# Patient Record
Sex: Female | Born: 1945 | Race: Black or African American | Hispanic: No | Smoking: Never smoker
Health system: Southern US, Community
[De-identification: ages and names within clinical notes are randomized; demographics above are authoritative.]

## PROBLEM LIST (undated history)

## (undated) DIAGNOSIS — K219 Gastro-esophageal reflux disease without esophagitis: Secondary | ICD-10-CM

## (undated) DIAGNOSIS — I10 Essential (primary) hypertension: Secondary | ICD-10-CM

## (undated) DIAGNOSIS — E785 Hyperlipidemia, unspecified: Secondary | ICD-10-CM

## (undated) DIAGNOSIS — N390 Urinary tract infection, site not specified: Secondary | ICD-10-CM

## (undated) HISTORY — DX: Gastro-esophageal reflux disease without esophagitis: K21.9

## (undated) HISTORY — DX: Essential (primary) hypertension: I10

## (undated) HISTORY — PX: OOPHORECTOMY: SHX86

## (undated) HISTORY — PX: TUBAL LIGATION: SHX77

## (undated) HISTORY — DX: Hyperlipidemia, unspecified: E78.5

## (undated) HISTORY — PX: ABDOMINAL HYSTERECTOMY: SHX81

## (undated) HISTORY — PX: TONSILLECTOMY: SUR1361

---

## 2011-07-21 ENCOUNTER — Emergency Department (INDEPENDENT_AMBULATORY_CARE_PROVIDER_SITE_OTHER): Payer: 59

## 2011-07-21 ENCOUNTER — Encounter: Payer: Self-pay | Admitting: Emergency Medicine

## 2011-07-21 ENCOUNTER — Emergency Department (HOSPITAL_BASED_OUTPATIENT_CLINIC_OR_DEPARTMENT_OTHER)
Admission: EM | Admit: 2011-07-21 | Discharge: 2011-07-21 | Disposition: A | Discharge status: Home / Self Care | Payer: 59 | Attending: Emergency Medicine | Admitting: Emergency Medicine

## 2011-07-21 DIAGNOSIS — R300 Dysuria: Secondary | ICD-10-CM | POA: Insufficient documentation

## 2011-07-21 DIAGNOSIS — R319 Hematuria, unspecified: Secondary | ICD-10-CM | POA: Insufficient documentation

## 2011-07-21 DIAGNOSIS — N3090 Cystitis, unspecified without hematuria: Secondary | ICD-10-CM | POA: Insufficient documentation

## 2011-07-21 DIAGNOSIS — Z79899 Other long term (current) drug therapy: Secondary | ICD-10-CM | POA: Insufficient documentation

## 2011-07-21 DIAGNOSIS — E119 Type 2 diabetes mellitus without complications: Secondary | ICD-10-CM | POA: Insufficient documentation

## 2011-07-21 DIAGNOSIS — K5730 Diverticulosis of large intestine without perforation or abscess without bleeding: Secondary | ICD-10-CM

## 2011-07-21 DIAGNOSIS — K8020 Calculus of gallbladder without cholecystitis without obstruction: Secondary | ICD-10-CM

## 2011-07-21 DIAGNOSIS — R109 Unspecified abdominal pain: Secondary | ICD-10-CM

## 2011-07-21 DIAGNOSIS — R350 Frequency of micturition: Secondary | ICD-10-CM

## 2011-07-21 LAB — CBC
Hemoglobin: 12.3 g/dL (ref 12.0–15.0)
RBC: 4.45 MIL/uL (ref 3.87–5.11)
WBC: 12 10*3/uL — ABNORMAL HIGH (ref 4.0–10.5)

## 2011-07-21 LAB — BASIC METABOLIC PANEL
CO2: 25 mEq/L (ref 19–32)
Chloride: 101 mEq/L (ref 96–112)
GFR calc non Af Amer: >60 mL/min (ref >60)
Glucose, Bld: 103 mg/dL — ABNORMAL HIGH (ref 70–99)
Potassium: 3.8 mEq/L (ref 3.5–5.1)
Sodium: 138 mEq/L (ref 135–145)

## 2011-07-21 LAB — URINE MICROSCOPIC-ADD ON

## 2011-07-21 LAB — URINALYSIS, ROUTINE W REFLEX MICROSCOPIC
Bilirubin Urine: NEGATIVE
Glucose, UA: NEGATIVE mg/dL
Ketones, ur: NEGATIVE mg/dL
pH: 6 (ref 5.0–8.0)

## 2011-07-21 MED ORDER — TRAMADOL HCL 50 MG PO TABS: 50.0000 mg | ORAL_TABLET | Freq: Once | ORAL | Status: DC

## 2011-07-21 MED ORDER — CIPROFLOXACIN HCL 500 MG PO TABS
500.0000 mg | ORAL_TABLET | Freq: Once | ORAL | Status: AC
  Administered 2011-07-21: 500 mg via ORAL
  Filled 2011-07-21: qty 1

## 2011-07-21 MED ORDER — TRAMADOL HCL 50 MG PO TABS
ORAL_TABLET | ORAL | Status: AC
  Filled 2011-07-21: qty 1

## 2011-07-21 MED ORDER — CIPROFLOXACIN HCL 500 MG PO TABS: 500.0000 mg | ORAL_TABLET | Freq: Two times a day (BID) | ORAL | Status: AC

## 2011-07-21 MED ORDER — HYDROCODONE-ACETAMINOPHEN 5-325 MG PO TABS: 1.0000 | ORAL_TABLET | ORAL | Status: AC | PRN

## 2011-07-21 NOTE — ED Notes (Signed)
Pt report burning with urination and frequency

## 2011-07-21 NOTE — ED Provider Notes (Signed)
History     CSN: [id number] Arrival date & time: 07/21/2011  2:34 AM  Chief Complaint  Patient presents with  . Urinary Tract Infection    HPI  (Consider location/radiation/quality/duration/timing/severity/associated sxs/prior treatment)  Patient is a 65 y.o. female presenting with urinary tract infection and hematuria. The history is provided by the patient. No language interpreter was used.  Urinary Tract Infection This is a new problem. The current episode started yesterday. The problem occurs constantly. The problem has not changed since onset.Pertinent negatives include no chest pain, no abdominal pain, no headaches and no shortness of breath. Exacerbated by: urinating. Relieved by: not urinating.  Hematuria This is a new problem. The current episode started yesterday. The problem is unchanged. She describes the hematuria as gross hematuria. The hematuria occurs during the initial portion of her urinary stream. She reports no clotting in her urine stream. Her pain is at a severity of 9/10. The pain is severe. She describes her urine color as tea colored. Irritative symptoms include frequency and urgency. Obstructive symptoms do not include dribbling, an intermittent stream, a slower stream or straining. Associated symptoms include dysuria and flank pain. Pertinent negatives include no abdominal pain, facial swelling, fever, inability to urinate, nausea or vomiting. Her sexual activity is non-contributory to the current illness. There is no history of GU trauma or kidney stones.    Past Medical History  Diagnosis Date  . Diabetes mellitus     Past Surgical History  Procedure Date  . Abdominal hysterectomy     No family history on file.  History  Substance Use Topics  . Smoking status: Never Smoker   . Smokeless tobacco: Not on file  . Alcohol Use: No    OB History    Grav Para Term Preterm Abortions TAB SAB Ect Mult Living                  Review of Systems  Review  of Systems  Constitutional: Negative for fever.  HENT: Negative for facial swelling.   Eyes: Negative for discharge.  Respiratory: Negative for apnea and shortness of breath.   Cardiovascular: Negative for chest pain.  Gastrointestinal: Negative for nausea, vomiting, abdominal pain and abdominal distention.  Genitourinary: Positive for dysuria, urgency, frequency, hematuria and flank pain. Negative for genital sores.  Musculoskeletal: Negative for arthralgias.  Neurological: Negative for dizziness and headaches.  Hematological: Negative.   Psychiatric/Behavioral: Negative.     Allergies  Erythromycin; Penicillins; and Sulfa antibiotics  Home Medications   Current Outpatient Rx  Name Route Sig Dispense Refill  . EZETIMIBE 10 MG PO TABS Oral Take 10 mg by mouth daily.      Marland Kitchen GLIPIZIDE 10 MG PO TABS Oral Take 10 mg by mouth 2 (two) times daily before a meal.      . LISINOPRIL 10 MG PO TABS Oral Take 10 mg by mouth daily.      Marland Kitchen METFORMIN HCL 500 MG (MOD) PO TB24 Oral Take 500 mg by mouth daily with breakfast.      . PANTOPRAZOLE SODIUM 20 MG PO TBEC Oral Take 20 mg by mouth daily.      Marland Kitchen ROSUVASTATIN CALCIUM 10 MG PO TABS Oral Take 10 mg by mouth daily.        Physical Exam    BP 126/86  Pulse 79  Temp 98 F (36.7 C)  Resp 18  SpO2 99%  Physical Exam  Constitutional: She is oriented to person, place, and time. She appears well-developed  and well-nourished.  HENT:  Head: Normocephalic and atraumatic.  Eyes: EOM are normal. Pupils are equal, round, and reactive to light. Right eye exhibits no discharge. Left eye exhibits no discharge.  Neck: Normal range of motion. Neck supple.  Cardiovascular: Normal rate and regular rhythm.   No murmur heard. Pulmonary/Chest: Effort normal and breath sounds normal. No respiratory distress.  Abdominal: Soft. Bowel sounds are normal. There is no tenderness. There is no rebound and no guarding.  Musculoskeletal: Normal range of motion.    Neurological: She is alert and oriented to person, place, and time.  Skin: Skin is warm and dry.  Psychiatric: She has a normal mood and affect.    ED Course  Procedures (including critical care time)  Labs Reviewed  URINALYSIS, ROUTINE W REFLEX MICROSCOPIC - Abnormal; Notable for the following:    Color, Urine RED (*) BIOCHEMICALS MAY BE AFFECTED BY COLOR   Appearance CLOUDY (*)    Hgb urine dipstick LARGE (*)    Protein, ur 100 (*)    Leukocytes, UA LARGE (*)    All other components within normal limits  URINE MICROSCOPIC-ADD ON - Abnormal; Notable for the following:    Squamous Epithelial / LPF FEW (*)    Bacteria, UA FEW (*)    All other components within normal limits  CBC - Abnormal; Notable for the following:    WBC 12.0 (*)    All other components within normal limits  BASIC METABOLIC PANEL - Abnormal; Notable for the following:    Glucose, Bld 103 (*)    All other components within normal limits   No results found.   No diagnosis found.   MDM   Patient informed of CT results and need to follow up for recheck of urine and to return for fevers, chills, inability to tolerate medications or any concerns.  She must also inform PMD of incidental finding of gall stones.  Patient verbalizes understanding and agrees to follow up   Tammara Massing Smitty Cords, MD 07/21/11 [phone]

## 2011-11-10 HISTORY — PX: CHOLECYSTECTOMY: SHX55

## 2013-02-19 ENCOUNTER — Emergency Department (HOSPITAL_BASED_OUTPATIENT_CLINIC_OR_DEPARTMENT_OTHER)
Admission: EM | Admit: 2013-02-19 | Discharge: 2013-02-19 | Disposition: A | Discharge status: Home / Self Care | Payer: PRIVATE HEALTH INSURANCE | Attending: Emergency Medicine | Admitting: Emergency Medicine

## 2013-02-19 ENCOUNTER — Emergency Department (HOSPITAL_BASED_OUTPATIENT_CLINIC_OR_DEPARTMENT_OTHER): Payer: PRIVATE HEALTH INSURANCE

## 2013-02-19 ENCOUNTER — Encounter (HOSPITAL_BASED_OUTPATIENT_CLINIC_OR_DEPARTMENT_OTHER): Payer: Self-pay

## 2013-02-19 DIAGNOSIS — R059 Cough, unspecified: Secondary | ICD-10-CM | POA: Insufficient documentation

## 2013-02-19 DIAGNOSIS — Z87440 Personal history of urinary (tract) infections: Secondary | ICD-10-CM | POA: Insufficient documentation

## 2013-02-19 DIAGNOSIS — J209 Acute bronchitis, unspecified: Secondary | ICD-10-CM | POA: Insufficient documentation

## 2013-02-19 DIAGNOSIS — R51 Headache: Secondary | ICD-10-CM | POA: Insufficient documentation

## 2013-02-19 DIAGNOSIS — Z880 Allergy status to penicillin: Secondary | ICD-10-CM | POA: Insufficient documentation

## 2013-02-19 DIAGNOSIS — E119 Type 2 diabetes mellitus without complications: Secondary | ICD-10-CM | POA: Insufficient documentation

## 2013-02-19 DIAGNOSIS — Z79899 Other long term (current) drug therapy: Secondary | ICD-10-CM | POA: Insufficient documentation

## 2013-02-19 DIAGNOSIS — J3489 Other specified disorders of nose and nasal sinuses: Secondary | ICD-10-CM | POA: Insufficient documentation

## 2013-02-19 DIAGNOSIS — Z7982 Long term (current) use of aspirin: Secondary | ICD-10-CM | POA: Insufficient documentation

## 2013-02-19 DIAGNOSIS — R05 Cough: Secondary | ICD-10-CM | POA: Insufficient documentation

## 2013-02-19 DIAGNOSIS — J40 Bronchitis, not specified as acute or chronic: Secondary | ICD-10-CM

## 2013-02-19 HISTORY — DX: Urinary tract infection, site not specified: N39.0

## 2013-02-19 MED ORDER — ALBUTEROL SULFATE HFA 108 (90 BASE) MCG/ACT IN AERS: 1.0000 | INHALATION_SPRAY | Freq: Four times a day (QID) | RESPIRATORY_TRACT | Status: AC | PRN

## 2013-02-19 MED ORDER — LEVOFLOXACIN 500 MG PO TABS: 500.0000 mg | ORAL_TABLET | Freq: Every day | ORAL | Status: DC

## 2013-02-19 NOTE — ED Notes (Signed)
Pt states that she has had uri symptoms x1 week or more, now states that she is concerned she may be getting pna.

## 2013-02-19 NOTE — ED Provider Notes (Signed)
History     CSN: [id number]  Arrival date & time 02/19/13  1138   First MD Initiated Contact with Patient 02/19/13 1351      Chief Complaint  Patient presents with  . URI    (Consider location/radiation/quality/duration/timing/severity/associated sxs/prior treatment) Patient is a 67 y.o. female presenting with URI. The history is provided by the patient. No language interpreter was used.  URI Presenting symptoms: congestion and cough   Severity:  Moderate Onset quality:  Gradual Duration:  1 week Timing:  Constant Progression:  Worsening Chronicity:  New Relieved by:  Nothing Worsened by:  Nothing tried Ineffective treatments:  None tried Associated symptoms: headaches   Risk factors: no sick contacts     Past Medical History  Diagnosis Date  . Diabetes mellitus   . UTI (lower urinary tract infection)     Past Surgical History  Procedure Laterality Date  . Abdominal hysterectomy    . Cholecystectomy  11/10/2011    History reviewed. No pertinent family history.  History  Substance Use Topics  . Smoking status: Never Smoker   . Smokeless tobacco: Never Used  . Alcohol Use: No    OB History   Grav Para Term Preterm Abortions TAB SAB Ect Mult Living                  Review of Systems  HENT: Positive for congestion.   Respiratory: Positive for cough.   Neurological: Positive for headaches.  All other systems reviewed and are negative.    Allergies  Darvon; Erythromycin; Lipitor; Penicillins; Sulfa antibiotics; and Zocor  Home Medications   Current Outpatient Rx  Name  Route  Sig  Dispense  Refill  . aspirin 81 MG tablet   Oral   Take 81 mg by mouth daily.         Marland Kitchen ezetimibe (ZETIA) 10 MG tablet   Oral   Take 10 mg by mouth daily.           Marland Kitchen glipiZIDE (GLUCOTROL) 10 MG tablet   Oral   Take 10 mg by mouth 2 (two) times daily before a meal.           . lisinopril (PRINIVIL,ZESTRIL) 10 MG tablet   Oral   Take 10 mg by mouth daily.            . metFORMIN (GLUMETZA) 500 MG (MOD) 24 hr tablet   Oral   Take 500 mg by mouth daily with breakfast.           . pantoprazole (PROTONIX) 20 MG tablet   Oral   Take 40 mg by mouth 2 (two) times daily.          . rosuvastatin (CRESTOR) 10 MG tablet   Oral   Take 10 mg by mouth daily.             BP 154/90  Pulse 67  Temp(Src) 98.1 F (36.7 C) (Oral)  Resp 18  Ht 5\' 2"  (1.575 m)  Wt 183 lb (83.008 kg)  BMI 33.46 kg/m2  SpO2 95%  Physical Exam  Nursing note and vitals reviewed. Constitutional: She is oriented to person, place, and time. She appears well-developed and well-nourished.  HENT:  Head: Normocephalic.  Right Ear: External ear normal.  Eyes: Conjunctivae and EOM are normal. Pupils are equal, round, and reactive to light.  Neck: Normal range of motion. Neck supple.  Cardiovascular: Normal rate.   Pulmonary/Chest: Effort normal.  Abdominal: Soft.  Musculoskeletal: Normal range  of motion.  Neurological: She is alert and oriented to person, place, and time.  Skin: Skin is warm.  Psychiatric: She has a normal mood and affect.    ED Course  Procedures (including critical care time)  Labs Reviewed - No data to display Dg Chest 2 View  02/19/2013  *RADIOLOGY REPORT*  Clinical Data: Upper respiratory tract infection  CHEST - 2 VIEW  Comparison: None  Findings: The heart size and mediastinal contours are within normal limits.  Both lungs are clear.  The visualized skeletal structures are remarkable for degenerative disc disease.  Prior cholecystectomy.  IMPRESSION: No active cardiopulmonary abnormalities.   Original Report Authenticated By: Signa Kell, M.D.      No diagnosis found.    MDM   levaquin and albuterol inhaler,   See your Physicain for recheck in 1 week if symptoms persist      Elson Areas, PA-C 02/19/13 1407

## 2013-02-20 NOTE — ED Provider Notes (Signed)
Medical screening examination/treatment/procedure(s) were performed by non-physician practitioner and as supervising physician I was immediately available for consultation/collaboration.   Mont Jagoda W. Aryahna Spagna, MD 02/20/13 0712 

## 2014-06-12 DIAGNOSIS — Z79899 Other long term (current) drug therapy: Secondary | ICD-10-CM | POA: Insufficient documentation

## 2014-06-12 DIAGNOSIS — R300 Dysuria: Secondary | ICD-10-CM | POA: Insufficient documentation

## 2014-06-12 DIAGNOSIS — E785 Hyperlipidemia, unspecified: Secondary | ICD-10-CM | POA: Insufficient documentation

## 2014-06-12 DIAGNOSIS — I10 Essential (primary) hypertension: Secondary | ICD-10-CM | POA: Insufficient documentation

## 2014-06-12 DIAGNOSIS — E1165 Type 2 diabetes mellitus with hyperglycemia: Secondary | ICD-10-CM | POA: Insufficient documentation

## 2014-09-05 IMAGING — CR DG CHEST 2V
2 series · 2 of 2 positions shown · non-contrast
Comparison: None

CLINICAL DATA: Upper respiratory tract infection

CHEST - 2 VIEW

[w chest pa]
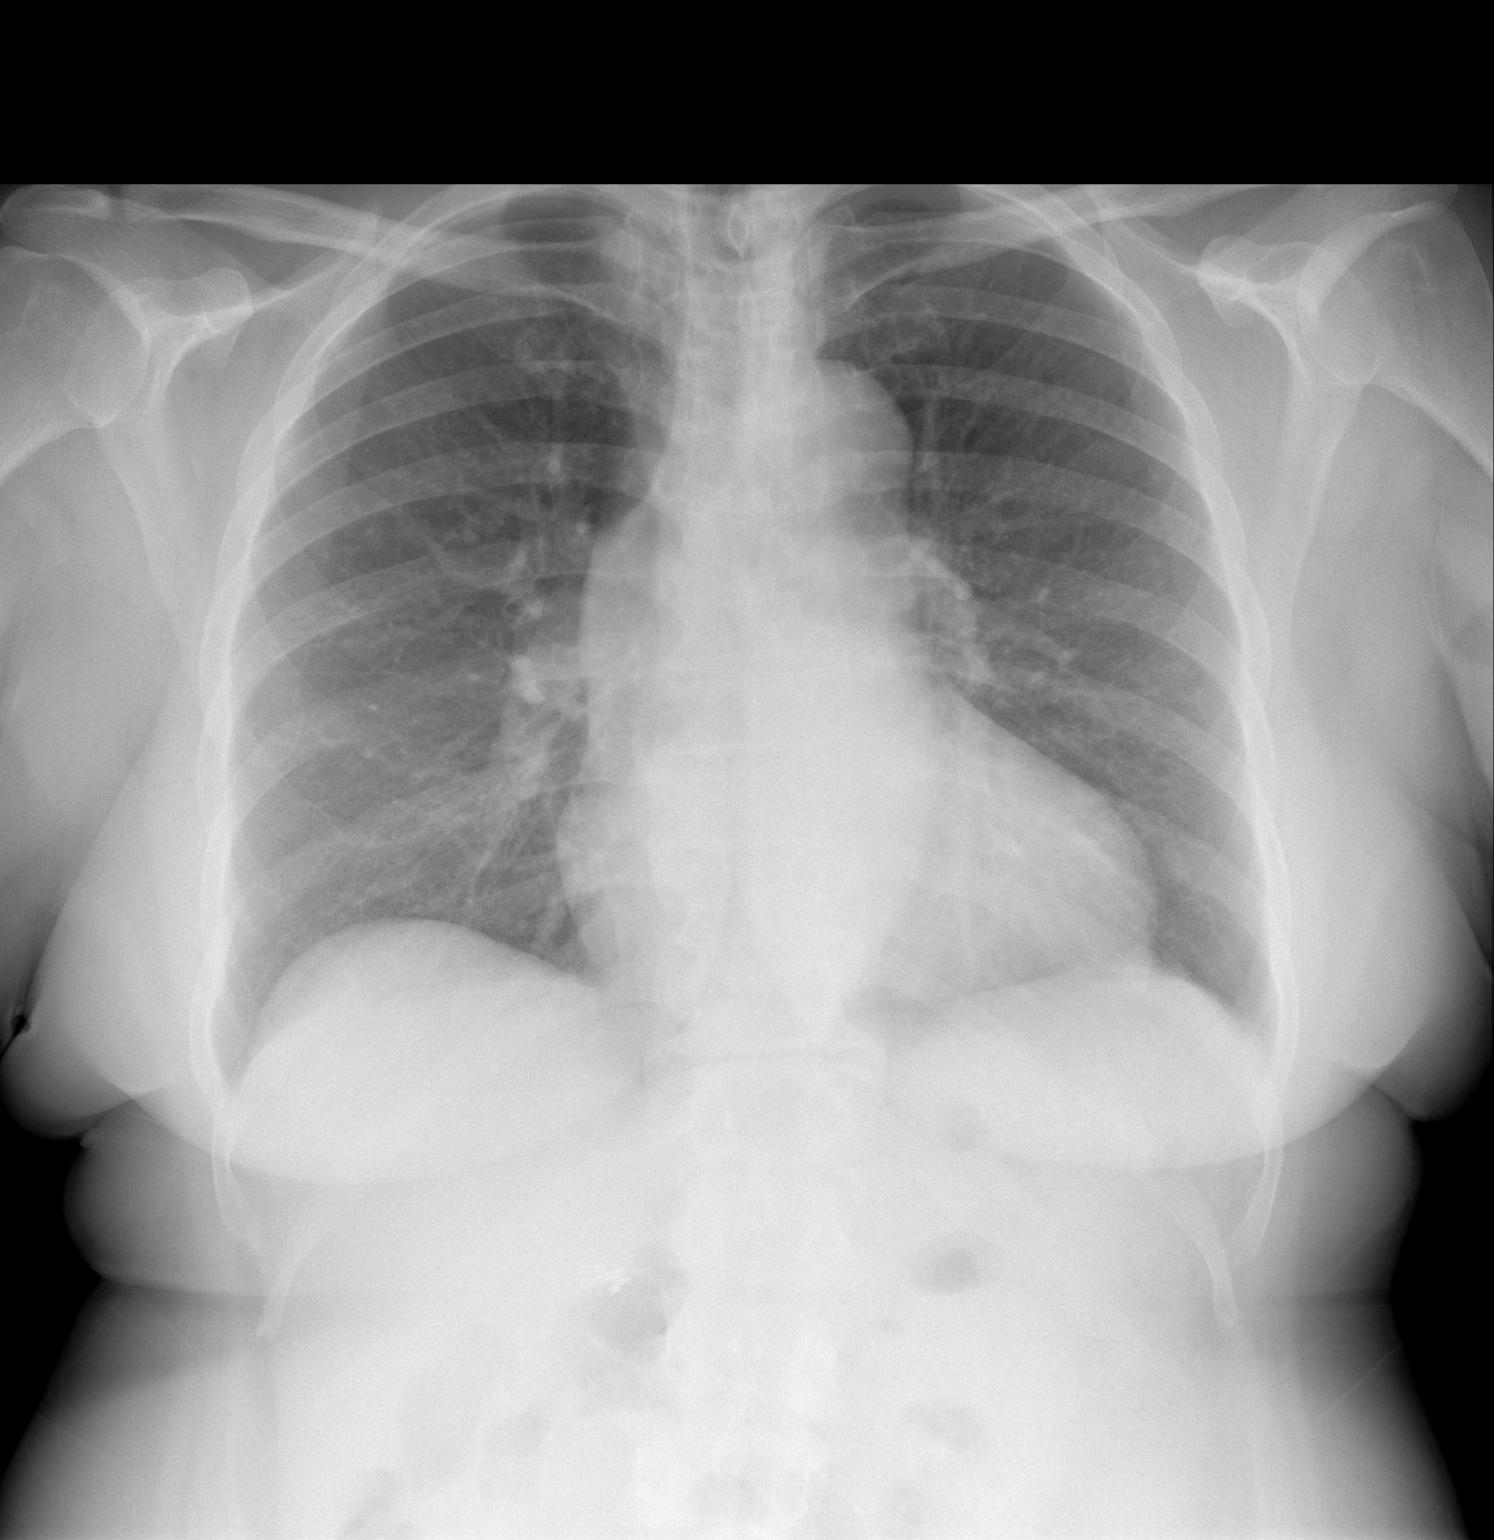

[w chest lat]
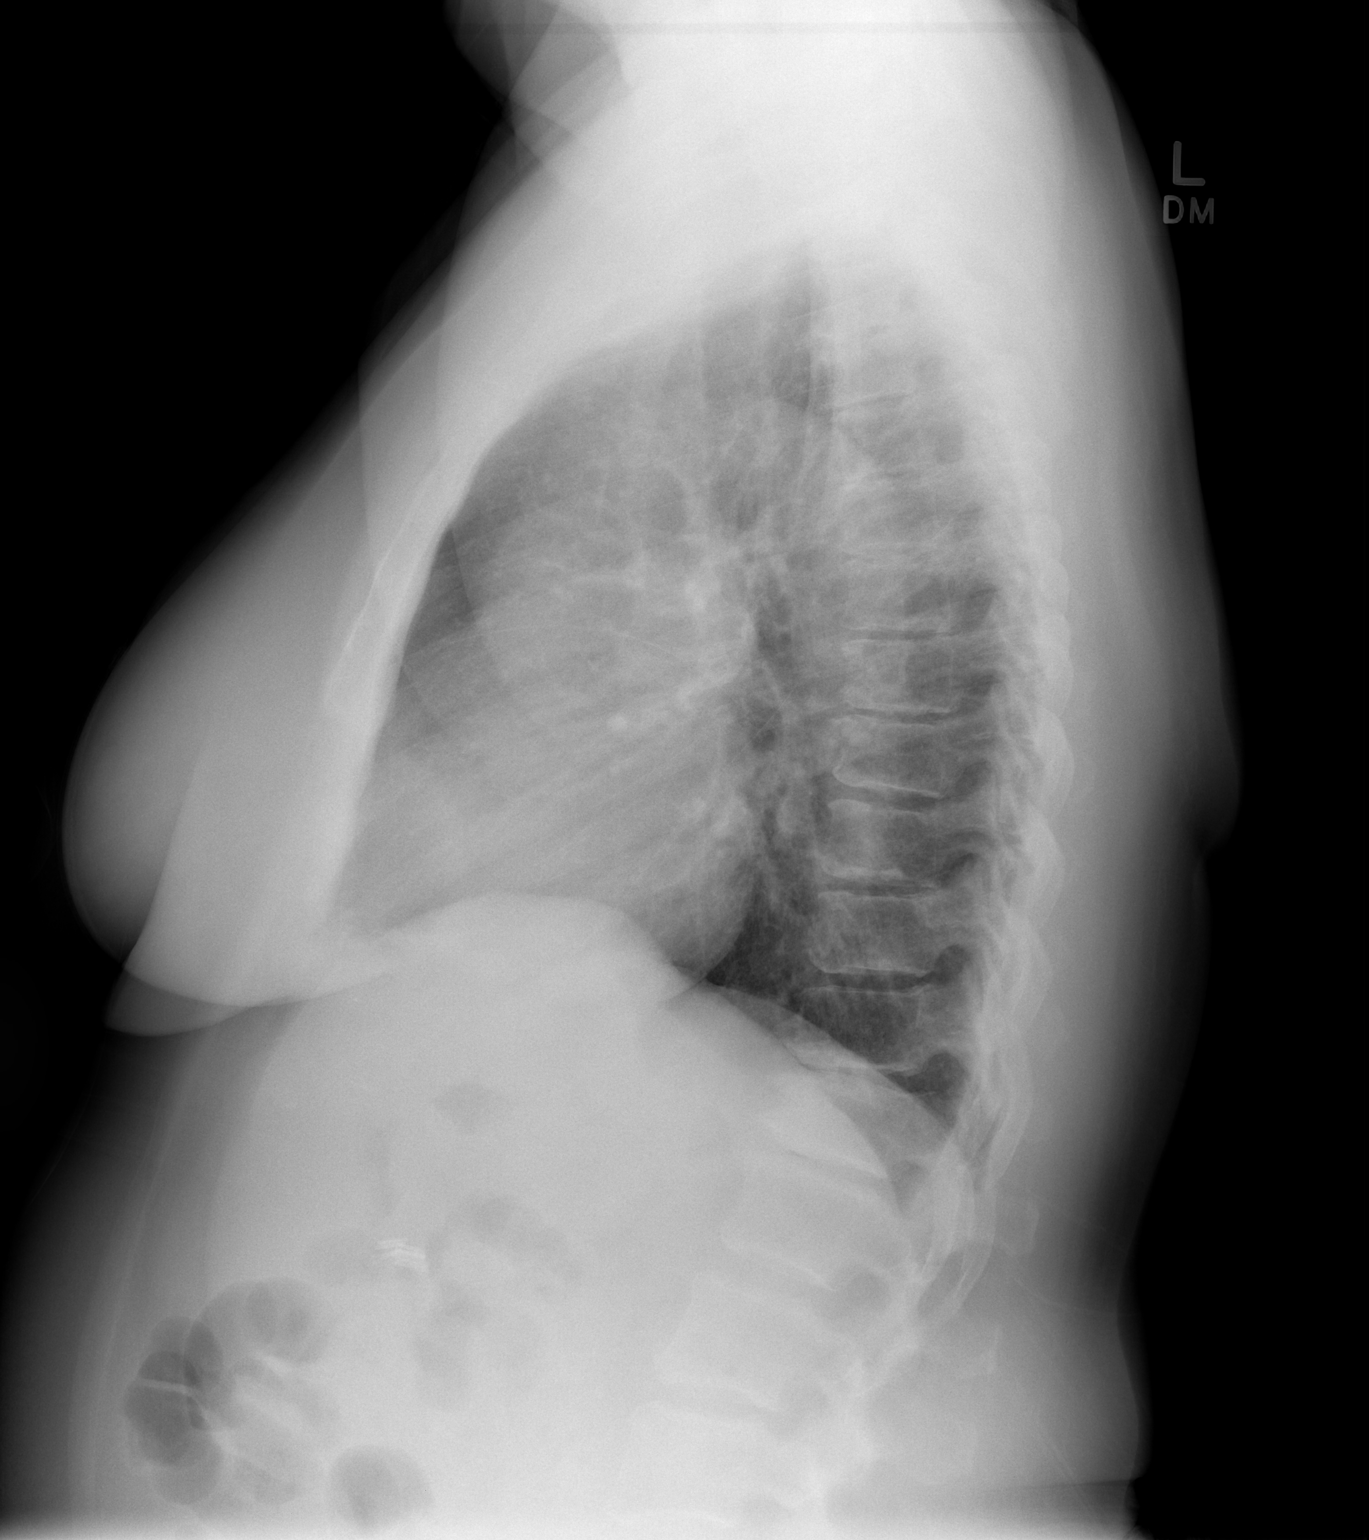

[2 of 2 positions shown; findings below may reference images not displayed]

FINDINGS: The heart size and mediastinal contours are within normal
limits.  Both lungs are clear.  The visualized skeletal structures
are remarkable for degenerative disc disease.  Prior
cholecystectomy..
IMPRESSION: No active cardiopulmonary abnormalities.

## 2015-01-09 DIAGNOSIS — K219 Gastro-esophageal reflux disease without esophagitis: Secondary | ICD-10-CM | POA: Insufficient documentation

## 2015-09-06 DIAGNOSIS — Z860100 Personal history of colon polyps, unspecified: Secondary | ICD-10-CM | POA: Insufficient documentation

## 2016-01-09 DIAGNOSIS — M159 Polyosteoarthritis, unspecified: Secondary | ICD-10-CM | POA: Insufficient documentation

## 2016-01-09 DIAGNOSIS — L299 Pruritus, unspecified: Secondary | ICD-10-CM | POA: Insufficient documentation

## 2016-06-20 DIAGNOSIS — J019 Acute sinusitis, unspecified: Secondary | ICD-10-CM | POA: Insufficient documentation

## 2016-06-20 DIAGNOSIS — L219 Seborrheic dermatitis, unspecified: Secondary | ICD-10-CM | POA: Insufficient documentation

## 2017-01-19 DIAGNOSIS — H2513 Age-related nuclear cataract, bilateral: Secondary | ICD-10-CM | POA: Insufficient documentation

## 2017-02-20 ENCOUNTER — Emergency Department (HOSPITAL_BASED_OUTPATIENT_CLINIC_OR_DEPARTMENT_OTHER): Payer: Medicare Other

## 2017-02-20 ENCOUNTER — Emergency Department (HOSPITAL_BASED_OUTPATIENT_CLINIC_OR_DEPARTMENT_OTHER)
Admission: EM | Admit: 2017-02-20 | Discharge: 2017-02-20 | Disposition: A | Discharge status: Home / Self Care | Payer: Medicare Other | Attending: Emergency Medicine | Admitting: Emergency Medicine

## 2017-02-20 ENCOUNTER — Encounter (HOSPITAL_BASED_OUTPATIENT_CLINIC_OR_DEPARTMENT_OTHER): Payer: Self-pay | Admitting: Emergency Medicine

## 2017-02-20 DIAGNOSIS — E119 Type 2 diabetes mellitus without complications: Secondary | ICD-10-CM | POA: Diagnosis not present

## 2017-02-20 DIAGNOSIS — Z7984 Long term (current) use of oral hypoglycemic drugs: Secondary | ICD-10-CM | POA: Diagnosis not present

## 2017-02-20 DIAGNOSIS — Z7982 Long term (current) use of aspirin: Secondary | ICD-10-CM | POA: Insufficient documentation

## 2017-02-20 DIAGNOSIS — M25512 Pain in left shoulder: Secondary | ICD-10-CM | POA: Insufficient documentation

## 2017-02-20 DIAGNOSIS — Z79899 Other long term (current) drug therapy: Secondary | ICD-10-CM | POA: Insufficient documentation

## 2017-02-20 MED ORDER — HYDROCODONE-ACETAMINOPHEN 5-325 MG PO TABS: 1.0000 | ORAL_TABLET | Freq: Four times a day (QID) | ORAL | 0 refills | Status: DC | PRN

## 2017-02-20 MED FILL — HYDROCODON-APAP 5-325: 5-325 | 3 days supply | Qty: 20 | Fill #0

## 2017-02-20 NOTE — ED Provider Notes (Signed)
[facility]   CSN: [id number] Arrival date & time: 02/20/17  1416     History   Chief Complaint Chief Complaint  Patient presents with  . Shoulder Pain    HPI Judith Hensley is a 71 y.o. female.  Patient with complaint of left shoulder pain for 2 days. Patient's had some pain in this shoulder in the past. But much worse past couple days. Patient does sleep with her arm above her head. Patient occasionally gets some right shoulder pain as well. No direct or known injury. Very difficult for her to raise her arm above her head. Denies any numbness to the fingers. Does seem to be very weak in the shoulder area.      Past Medical History:  Diagnosis Date  . Diabetes mellitus   . UTI (lower urinary tract infection)     There are no active problems to display for this patient.   Past Surgical History:  Procedure Laterality Date  . ABDOMINAL HYSTERECTOMY    . CHOLECYSTECTOMY  11/10/2011  . TONSILLECTOMY      OB History    No data available       Home Medications    Prior to Admission medications   Medication Sig Start Date End Date Taking? Authorizing Provider  albuterol (PROVENTIL HFA;VENTOLIN HFA) 108 (90 BASE) MCG/ACT inhaler Inhale 1-2 puffs into the lungs every 6 (six) hours as needed for wheezing. 02/19/13   Elson Areas, PA-C  aspirin 81 MG tablet Take 81 mg by mouth daily.    Historical Provider, MD  ezetimibe (ZETIA) 10 MG tablet Take 10 mg by mouth daily.      Historical Provider, MD  glipiZIDE (GLUCOTROL) 10 MG tablet Take 10 mg by mouth 2 (two) times daily before a meal.      Historical Provider, MD  HYDROcodone-acetaminophen (NORCO/VICODIN) 5-325 MG tablet Take 1-2 tablets by mouth every 6 (six) hours as needed for moderate pain. 02/20/17   Vanetta Mulders, MD  levofloxacin (LEVAQUIN) 500 MG tablet Take 1 tablet (500 mg total) by mouth daily. 02/19/13   Elson Areas, PA-C  lisinopril (PRINIVIL,ZESTRIL) 10 MG tablet Take 10 mg by  mouth daily.      Historical Provider, MD  metFORMIN (GLUMETZA) 500 MG (MOD) 24 hr tablet Take 500 mg by mouth daily with breakfast.      Historical Provider, MD  pantoprazole (PROTONIX) 20 MG tablet Take 40 mg by mouth 2 (two) times daily.     Historical Provider, MD  rosuvastatin (CRESTOR) 10 MG tablet Take 10 mg by mouth daily.      Historical Provider, MD    Family History History reviewed. No pertinent family history.  Social History Social History  Substance Use Topics  . Smoking status: Never Smoker  . Smokeless tobacco: Never Used  . Alcohol use No     Allergies   Darvon [propoxyphene hcl]; Erythromycin; Lipitor [atorvastatin]; Penicillins; Sulfa antibiotics; and Zocor [simvastatin]   Review of Systems Review of Systems  Constitutional: Negative for fever.  HENT: Negative for congestion.   Eyes: Negative for redness.  Respiratory: Negative for shortness of breath.   Cardiovascular: Negative for chest pain.  Gastrointestinal: Negative for abdominal pain.  Musculoskeletal: Positive for arthralgias.  Skin: Negative for rash.  Neurological: Positive for weakness. Negative for numbness.  Hematological: Does not bruise/bleed easily.  Psychiatric/Behavioral: Negative for confusion.     Physical Exam Updated Vital Signs BP (!) 176/96   Pulse 79   Temp  98.2 F (36.8 C) (Oral)   Resp 18   Ht 5' 2.5" (1.588 m)   Wt 83.5 kg   SpO2 100%   BMI 33.12 kg/m   Physical Exam  Constitutional: She is oriented to person, place, and time. She appears well-developed and well-nourished. No distress.  HENT:  Head: Normocephalic and atraumatic.  Mouth/Throat: Oropharynx is clear and moist.  Eyes: EOM are normal. Pupils are equal, round, and reactive to light.  Neck: Normal range of motion. Neck supple.  Cardiovascular: Normal rate and regular rhythm.   Pulmonary/Chest: Effort normal and breath sounds normal.  Abdominal: Soft. Bowel sounds are normal. There is no tenderness.    Musculoskeletal: Normal range of motion. She exhibits tenderness. She exhibits no edema or deformity.  Tenderness to palpation to the left shoulder significant increase in pain with range of motion particular trying to raise the arm above the head. Radial pulse distally is intact. No obvious deformity. Elbow without any pain with range of motion wrist without any pain. Sensation intact to the hand as well.  Neurological: She is alert and oriented to person, place, and time. No cranial nerve deficit or sensory deficit. She exhibits normal muscle tone. Coordination normal.  Skin: Skin is warm. Capillary refill takes less than 2 seconds.  Nursing note and vitals reviewed.    ED Treatments / Results  Labs (all labs ordered are listed, but only abnormal results are displayed) Labs Reviewed - No data to display  EKG  EKG Interpretation None       Radiology Dg Shoulder Left  Result Date: 02/20/2017 CLINICAL DATA:  Left posterior shoulder pain.  No known injury. EXAM: LEFT SHOULDER - 2+ VIEW COMPARISON:  Chest x-ray 02/19/2013 . FINDINGS: No acute bony or joint abnormality identified. No evidence of fracture or dislocation. Calcification in the soft tissues adjacent to the left humeral head possibly related to tendinosis and/or bursitis. Consistent calcific supraspinatus tendinosis . IMPRESSION: Over 1 No acute abnormality . 2. Calcification noted over the soft tissues adjacent to the left humeral head possibly relates tendinosis and/or bursitis. Electronically Signed   By: Maisie Fus  Register   On: 02/20/2017 15:53    Procedures Procedures (including critical care time)  Medications Ordered in ED Medications - No data to display   Initial Impression / Assessment and Plan / ED Course  I have reviewed the triage vital signs and the nursing notes.  Pertinent labs & imaging results that were available during my care of the patient were reviewed by me and considered in my medical decision  making (see chart for details).     Patient's presentation complaint an exam and x-rays highly suggestive of left rotator cuff tendinitis or injury. Will treat with sling have follow-up sports medicine.  Final Clinical Impressions(s) / ED Diagnoses   Final diagnoses:  Acute pain of left shoulder    New Prescriptions New Prescriptions   HYDROCODONE-ACETAMINOPHEN (NORCO/VICODIN) 5-325 MG TABLET    Take 1-2 tablets by mouth every 6 (six) hours as needed for moderate pain.     Vanetta Mulders, MD 02/20/17 [phone]

## 2017-02-20 NOTE — Discharge Instructions (Signed)
Shoulder immobilizer for comfort. They Wanted to follow-up with sports medicine upstairs. Take the hydrocodone as needed.

## 2017-02-20 NOTE — ED Triage Notes (Signed)
Left shoulder pain x 2 days, tender to the touch

## 2017-09-14 DIAGNOSIS — R430 Anosmia: Secondary | ICD-10-CM | POA: Insufficient documentation

## 2018-01-27 DIAGNOSIS — E119 Type 2 diabetes mellitus without complications: Secondary | ICD-10-CM | POA: Insufficient documentation

## 2018-09-06 IMAGING — DX DG SHOULDER 2+V*L*
2 series · 2 of 2 positions shown · non-contrast
Comparison: Chest x-ray 02/19/2013 .

CLINICAL DATA: Left posterior shoulder pain.  No known injury.

EXAM:
LEFT SHOULDER - 2+ VIEW

[shoulder grashey]
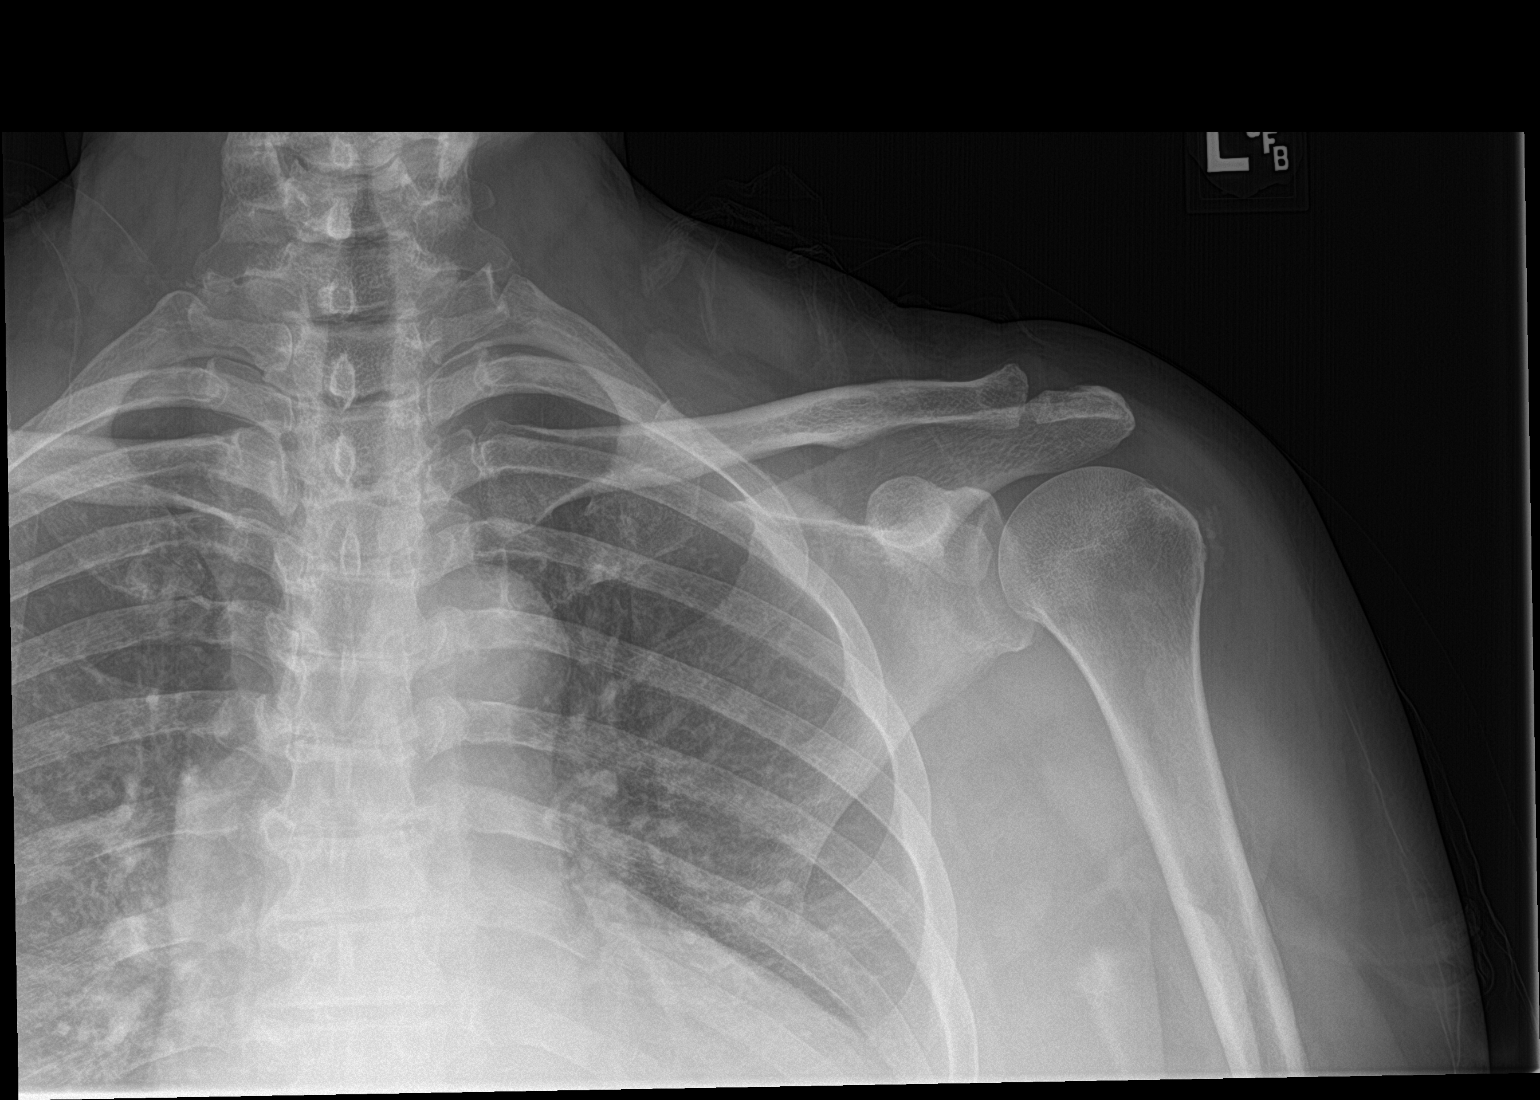

[shoulder y view]
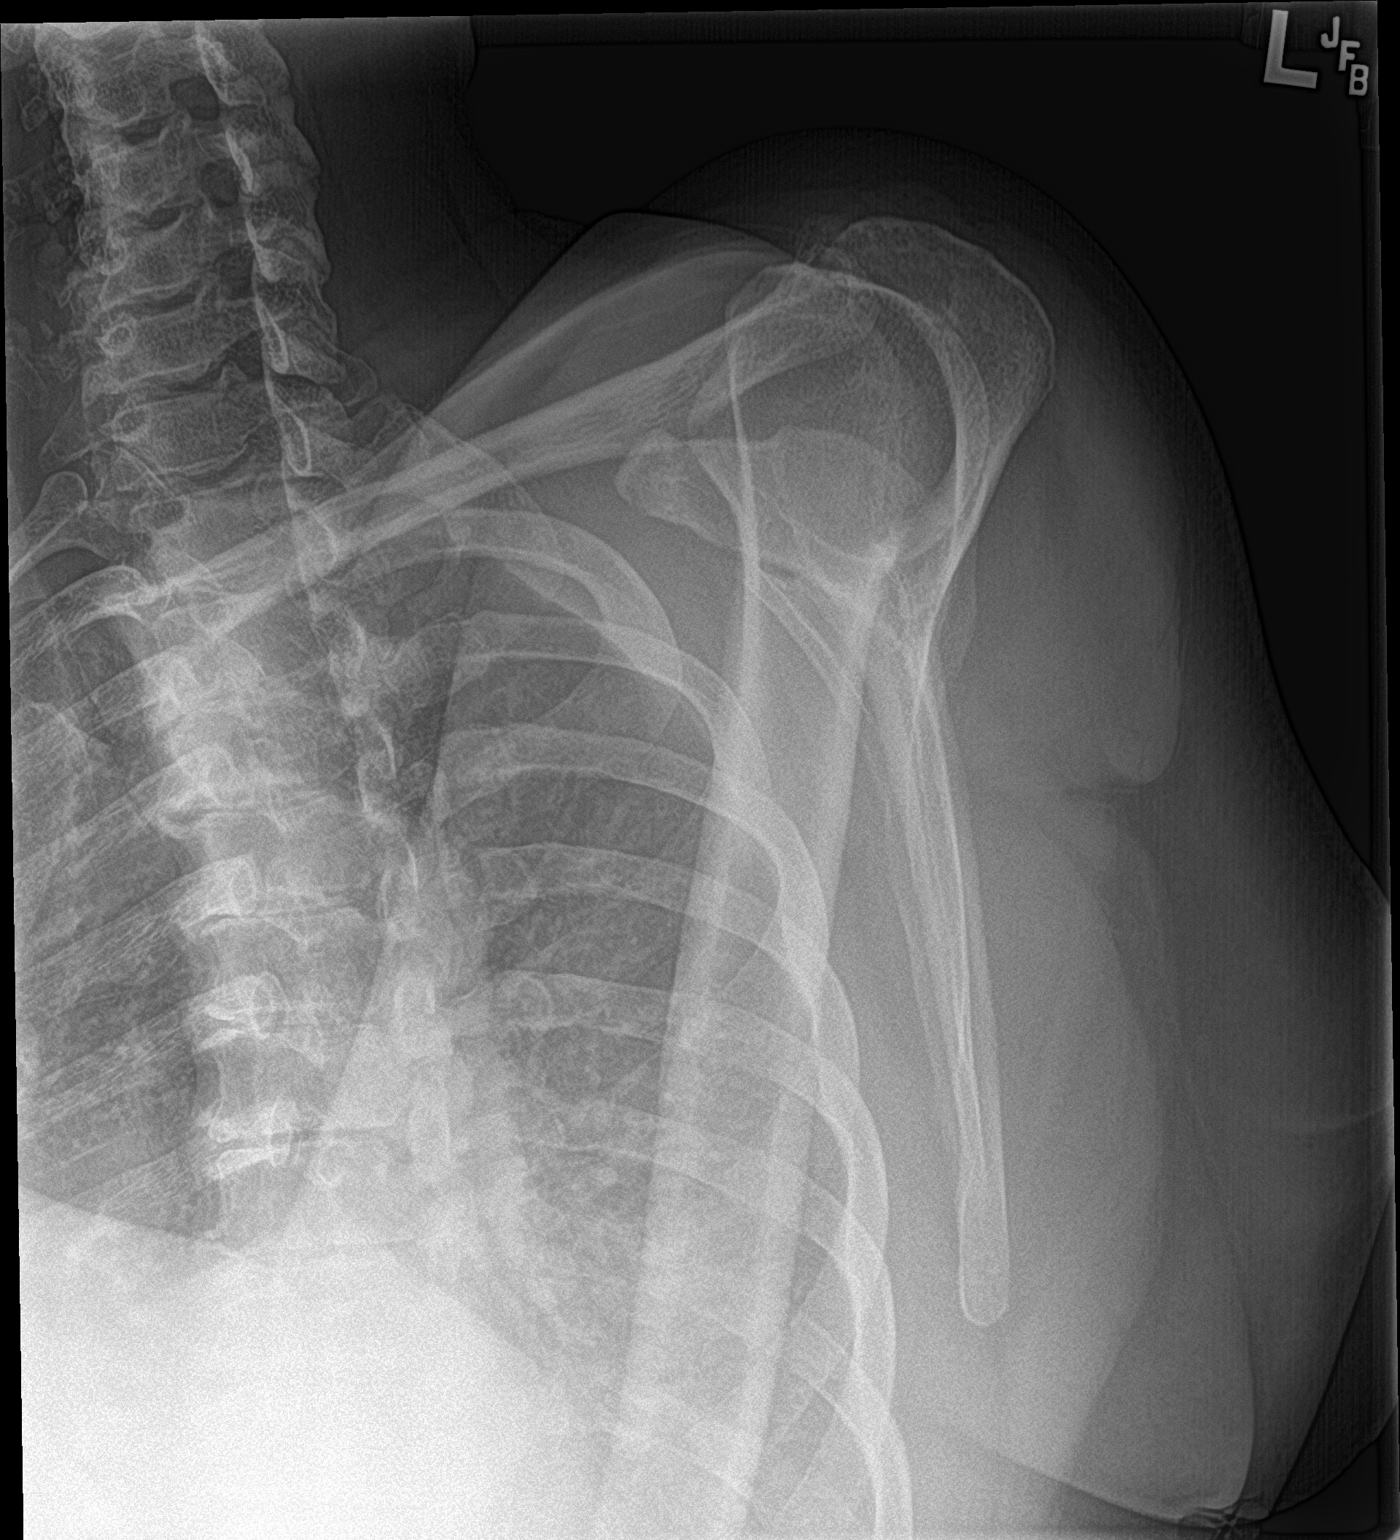

[2 of 2 positions shown; findings below may reference images not displayed]

FINDINGS: No acute bony or joint abnormality identified. No evidence of
fracture or dislocation. Calcification in the soft tissues adjacent
to the left humeral head possibly related to tendinosis and/or
bursitis. Consistent calcific supraspinatus tendinosis .
IMPRESSION: Over 1 No acute abnormality .

2. Calcification noted over the soft tissues adjacent to the left
humeral head possibly relates tendinosis and/or bursitis.

## 2020-03-10 ENCOUNTER — Other Ambulatory Visit: Payer: Self-pay

## 2020-03-10 ENCOUNTER — Encounter (HOSPITAL_BASED_OUTPATIENT_CLINIC_OR_DEPARTMENT_OTHER): Payer: Self-pay | Admitting: Emergency Medicine

## 2020-03-10 ENCOUNTER — Emergency Department (HOSPITAL_BASED_OUTPATIENT_CLINIC_OR_DEPARTMENT_OTHER)
Admission: EM | Admit: 2020-03-10 | Discharge: 2020-03-10 | Disposition: A | Discharge status: Home / Self Care | Payer: Medicare Other | Attending: Emergency Medicine | Admitting: Emergency Medicine

## 2020-03-10 DIAGNOSIS — H6092 Unspecified otitis externa, left ear: Secondary | ICD-10-CM | POA: Diagnosis not present

## 2020-03-10 DIAGNOSIS — Z880 Allergy status to penicillin: Secondary | ICD-10-CM | POA: Diagnosis not present

## 2020-03-10 DIAGNOSIS — E119 Type 2 diabetes mellitus without complications: Secondary | ICD-10-CM | POA: Insufficient documentation

## 2020-03-10 DIAGNOSIS — Z882 Allergy status to sulfonamides status: Secondary | ICD-10-CM | POA: Diagnosis not present

## 2020-03-10 DIAGNOSIS — Z7982 Long term (current) use of aspirin: Secondary | ICD-10-CM | POA: Diagnosis not present

## 2020-03-10 DIAGNOSIS — Z7984 Long term (current) use of oral hypoglycemic drugs: Secondary | ICD-10-CM | POA: Insufficient documentation

## 2020-03-10 DIAGNOSIS — H9202 Otalgia, left ear: Secondary | ICD-10-CM | POA: Diagnosis present

## 2020-03-10 DIAGNOSIS — Z888 Allergy status to other drugs, medicaments and biological substances status: Secondary | ICD-10-CM | POA: Insufficient documentation

## 2020-03-10 DIAGNOSIS — H6123 Impacted cerumen, bilateral: Secondary | ICD-10-CM | POA: Insufficient documentation

## 2020-03-10 DIAGNOSIS — Z79899 Other long term (current) drug therapy: Secondary | ICD-10-CM | POA: Diagnosis not present

## 2020-03-10 DIAGNOSIS — H60501 Unspecified acute noninfective otitis externa, right ear: Secondary | ICD-10-CM

## 2020-03-10 MED ORDER — CIPROFLOXACIN HCL 500 MG PO TABS: 500.0000 mg | ORAL_TABLET | Freq: Two times a day (BID) | ORAL | 0 refills | Status: AC

## 2020-03-10 MED ORDER — OFLOXACIN 0.3 % OP SOLN
5.0000 [drp] | Freq: Every day | OPHTHALMIC | Status: DC
  Administered 2020-03-10: 5 [drp] via OTIC
  Filled 2020-03-10: qty 5

## 2020-03-10 MED ORDER — CIPROFLOXACIN HCL 500 MG PO TABS
500.0000 mg | ORAL_TABLET | Freq: Once | ORAL | Status: AC
  Administered 2020-03-10: 500 mg via ORAL
  Filled 2020-03-10: qty 1

## 2020-03-10 MED ORDER — OFLOXACIN 0.3 % OT SOLN: 5.0000 [drp] | Freq: Two times a day (BID) | OTIC | 0 refills | Status: AC

## 2020-03-10 NOTE — ED Triage Notes (Signed)
Patient states that she started to have ear pain and itching to her left ear about tues. She reports that she has tried some OTC drops but it had not helped

## 2020-03-10 NOTE — ED Provider Notes (Signed)
[facility] EMERGENCY DEPARTMENT Provider Note   CSN: [id number] Arrival date & time: 03/10/20  1648     History Chief Complaint  Patient presents with  . Otalgia    Judith Hensley is a 74 y.o. female with PMH/o DM who presents for evaluation of left ear pain x 5 days. She reports that she at first thought it was allergies at initial onset. She states that she had pain in the ear itself and then some itching around it. She states she used a qtip to clean is out but has not inserted anything else into the ear. Last night she bought some OTC ear drops to see if that would help but she did not have any improvement.  She feels like the pain radiates down her face. She also feels like her lymph node on the left side is swollen. She states she has not any facial swelling, erythema.  Denies any fevers.  She has not had any sore throat or dental pain, dental pain, difficulty breathing, headache.  The history is provided by the patient.       Past Medical History:  Diagnosis Date  . Diabetes mellitus   . UTI (lower urinary tract infection)     There are no problems to display for this patient.   Past Surgical History:  Procedure Laterality Date  . ABDOMINAL HYSTERECTOMY    . CHOLECYSTECTOMY  11/10/2011  . TONSILLECTOMY       OB History   No obstetric history on file.     History reviewed. No pertinent family history.  Social History   Tobacco Use  . Smoking status: Never Smoker  . Smokeless tobacco: Never Used  Substance Use Topics  . Alcohol use: No  . Drug use: No    Home Medications Prior to Admission medications   Medication Sig Start Date End Date Taking? Authorizing Provider  albuterol (PROVENTIL HFA;VENTOLIN HFA) 108 (90 BASE) MCG/ACT inhaler Inhale 1-2 puffs into the lungs every 6 (six) hours as needed for wheezing. 02/19/13   Fransico Meadow, PA-C  aspirin 81 MG tablet Take 81 mg by mouth daily.    [provider]  ciprofloxacin (CIPRO) 500 MG  tablet Take 1 tablet (500 mg total) by mouth every 12 (twelve) hours for 7 days. 03/10/20 03/17/20  Volanda Napoleon, PA-C  ezetimibe (ZETIA) 10 MG tablet Take 10 mg by mouth daily.      [provider]  glipiZIDE (GLUCOTROL) 10 MG tablet Take 10 mg by mouth 2 (two) times daily before a meal.      [provider]  HYDROcodone-acetaminophen (NORCO/VICODIN) 5-325 MG tablet Take 1-2 tablets by mouth every 6 (six) hours as needed for moderate pain. 02/20/17   Fredia Sorrow, MD  levofloxacin (LEVAQUIN) 500 MG tablet Take 1 tablet (500 mg total) by mouth daily. 02/19/13   Fransico Meadow, PA-C  lisinopril (PRINIVIL,ZESTRIL) 10 MG tablet Take 10 mg by mouth daily.      [provider]  metFORMIN (GLUMETZA) 500 MG (MOD) 24 hr tablet Take 500 mg by mouth daily with breakfast.      [provider]  ofloxacin (FLOXIN) 0.3 % OTIC solution Place 5 drops into the left ear 2 (two) times daily for 7 days. 03/10/20 03/17/20  Volanda Napoleon, PA-C  pantoprazole (PROTONIX) 20 MG tablet Take 40 mg by mouth 2 (two) times daily.     [provider]  rosuvastatin (CRESTOR) 10 MG tablet Take 10 mg by mouth  daily.      [provider]    Allergies    Darvon [propoxyphene hcl], Erythromycin, Lipitor [atorvastatin], Penicillins, Sulfa antibiotics, and Zocor [simvastatin]  Review of Systems   Review of Systems  Constitutional: Negative for fever.  HENT: Positive for ear pain. Negative for ear discharge, facial swelling and trouble swallowing.   Respiratory: Negative for shortness of breath.   Gastrointestinal: Negative for vomiting.  All other systems reviewed and are negative.   Physical Exam Updated Vital Signs BP 126/73 (BP Location: Left Arm)   Pulse (!) 53   Temp 98 F (36.7 C) (Oral)   Resp 18   Wt 71.7 kg   SpO2 98%   BMI 28.44 kg/m   Physical Exam Vitals and nursing note reviewed.  Constitutional:      Appearance: She is well-developed.  HENT:      Head: Normocephalic and atraumatic.     Comments: Face is symmetric in appearance without any overlying warmth, erythema.    Right Ear: No mastoid tenderness.     Left Ear: No mastoid tenderness.     Ears:     Comments: Unable to visualize bilateral TM secondary to cerumen impaction.  Mastoid process bilaterally is without erythema, edema, tenderness.  Visualization after cerumen removal shows left TM is intact without perforation, erythema, edema, effusion.  The external auditory canal does appear edematous with some irritation.  She does have pain with movement of the external ear.    Mouth/Throat:     Comments: Posterior oropharynx is clear without any signs of erythema, edema.  No gingival erythema, edema.  No identifiable dental abscess. Eyes:     General: No scleral icterus.       Right eye: No discharge.        Left eye: No discharge.     Conjunctiva/sclera: Conjunctivae normal.  Pulmonary:     Effort: Pulmonary effort is normal.  Lymphadenopathy:     Cervical: Cervical adenopathy present.     Left cervical: Superficial cervical adenopathy present.     Comments: Small tender superficial cervical lymphadenopathy.  No overlying warmth, erythema.  Skin:    General: Skin is warm and dry.  Neurological:     Mental Status: She is alert.  Psychiatric:        Speech: Speech normal.        Behavior: Behavior normal.     ED Results / Procedures / Treatments   Labs (all labs ordered are listed, but only abnormal results are displayed) Labs Reviewed - No data to display  EKG None  Radiology No results found.  Procedures Procedures (including critical care time)  Medications Ordered in ED Medications  ofloxacin (OCUFLOX) 0.3 % ophthalmic solution 5 drop (5 drops Right EAR Given 03/10/20 2017)  ciprofloxacin (CIPRO) tablet 500 mg (500 mg Oral Given 03/10/20 2017)    ED Course  I have reviewed the triage vital signs and the nursing notes.  Pertinent labs & imaging  results that were available during my care of the patient were reviewed by me and considered in my medical decision making (see chart for details).    MDM Rules/Calculators/A&P                       74 year old female past medical 3 of diabetes who presents for evaluation of ear pain x5 days.  She reports that she has been using over-the-counter eardrops with no improvement.  No fevers.  She reports she has also  had some swollen nodes on the left side.  On initially arrival, she is afebrile, nontoxic-appearing.  Vital signs are stable.  On exam, unable to visualize left TM secondary to cerumen impaction.  Exam is not concerning for temporal arteritis, mastoiditis.  We will plan for cerumen removal and reevaluation.  Reevaluation after cerumen removal.  Improve visualization of TM.  Appears to be intact without any signs of perforation.  The external auditory canal does appear edematous with some irritation.  Question if this is early [place] acute otitis media.  Do not see any evidence of malignant otitis media.  Given history of diabetes, will plan to treat with antibiotics and eardrops.  Will start patient on Cipro.  Patient does take glipizide.  I discussed with patient that Cipro can cause her glipizide to be more effective.  I advised her to stop her glipizide while she is on the antibiotics and just take Metformin.  Patient instructed to closely monitor her blood sugars.  Patient instructed to follow-up with ear nose and throat she does not have any improvement in symptoms. At this time, patient exhibits no emergent life-threatening condition that require further evaluation in ED or admission. Patient had ample opportunity for questions and discussion. All patient's questions were answered with full understanding. Strict return precautions discussed. Patient expresses understanding and agreement to plan.   Portions of this note were generated with Scientist, clinical (histocompatibility and immunogenetics). Dictation errors may occur  despite best attempts at proofreading.   Final Clinical Impression(s) / ED Diagnoses Final diagnoses:  Acute otitis externa of right ear, unspecified type    Rx / DC Orders ED Discharge Orders         Ordered    ciprofloxacin (CIPRO) 500 MG tablet  Every 12 hours     03/10/20 2008    ofloxacin (FLOXIN) 0.3 % OTIC solution  2 times daily     03/10/20 2008           Rosana Hoes 03/11/20 0011    Tilden Fossa, MD 03/11/20 [phone]

## 2020-03-10 NOTE — Discharge Instructions (Signed)
Take antibiotics as directed. Please take all of your antibiotics until finished.  This antibiotic can sometimes interfere with your glipizide.  You should hold this while you are taking your antibiotic.  Take your Metformin as directed.  Closely monitor your sugars.  Use antibiotic ear drops as directed.   As we discussed, if his symptoms do not improve after antibiotics, you will need to follow-up with referred ear nose and throat doctor.  Return the emergency department for any high fever, drainage from the ear, difficulty swallowing, difficulty breathing, vomiting or any other worsening or concerning symptoms.

## 2020-12-24 DIAGNOSIS — K5730 Diverticulosis of large intestine without perforation or abscess without bleeding: Secondary | ICD-10-CM | POA: Insufficient documentation

## 2020-12-24 DIAGNOSIS — Z800 Family history of malignant neoplasm of digestive organs: Secondary | ICD-10-CM | POA: Insufficient documentation

## 2020-12-24 DIAGNOSIS — K2270 Barrett's esophagus without dysplasia: Secondary | ICD-10-CM | POA: Insufficient documentation

## 2022-01-10 DIAGNOSIS — K582 Mixed irritable bowel syndrome: Secondary | ICD-10-CM | POA: Insufficient documentation

## 2022-01-10 DIAGNOSIS — E1143 Type 2 diabetes mellitus with diabetic autonomic (poly)neuropathy: Secondary | ICD-10-CM | POA: Insufficient documentation

## 2022-06-10 DIAGNOSIS — I700 Atherosclerosis of aorta: Secondary | ICD-10-CM | POA: Insufficient documentation

## 2022-08-04 DIAGNOSIS — M47812 Spondylosis without myelopathy or radiculopathy, cervical region: Secondary | ICD-10-CM | POA: Insufficient documentation

## 2022-08-04 DIAGNOSIS — G44209 Tension-type headache, unspecified, not intractable: Secondary | ICD-10-CM | POA: Insufficient documentation

## 2022-10-14 DIAGNOSIS — Z8249 Family history of ischemic heart disease and other diseases of the circulatory system: Secondary | ICD-10-CM | POA: Insufficient documentation

## 2022-12-26 DIAGNOSIS — E739 Lactose intolerance, unspecified: Secondary | ICD-10-CM | POA: Insufficient documentation

## 2022-12-26 DIAGNOSIS — R1310 Dysphagia, unspecified: Secondary | ICD-10-CM | POA: Insufficient documentation

## 2024-02-09 ENCOUNTER — Other Ambulatory Visit: Payer: Self-pay | Admitting: Family Medicine

## 2024-02-09 MED ORDER — METFORMIN HCL ER (MOD) 500 MG PO TB24: 500.0000 mg | ORAL_TABLET | Freq: Every day | ORAL | 0 refills | Status: DC

## 2024-02-09 MED ORDER — EZETIMIBE 10 MG PO TABS: 10.0000 mg | ORAL_TABLET | Freq: Every day | ORAL | 0 refills | Status: DC

## 2024-02-09 NOTE — Telephone Encounter (Signed)
 Copied from CRM [id number]. Topic: Clinical - Medication Refill >> Feb 09, 2024  1:41 PM DeAngela L wrote: Most Recent Primary Care Visit:   Medication: metFORMIN (GLUMETZA) 500 MG (MOD) 24 hr tablet ezetimibe (ZETIA) 10 MG tablet   Has the patient contacted their pharmacy? Yes  (Agent: If no, request that the patient contact the pharmacy for the refill. If patient does not wish to contact the pharmacy document the reason why and proceed with request.) (Agent: If yes, when and what did the pharmacy advise?)  Is this the correct pharmacy for this prescription? Yes  If no, delete pharmacy and type the correct one.  This is the patient's preferred pharmacy:  [id number], [place] - [address] AT [facility] [address] [place] [address] Phone: [phone] Fax: [phone]   Has the prescription been filled recently? Yes   Is the patient out of the medication? No   Has the patient been seen for an appointment in the last year OR does the patient have an upcoming appointment? Yes   Can we respond through MyChart? No   Agent: Please be advised that Rx refills may take up to 3 business days. We ask that you follow-up with your pharmacy.

## 2024-02-11 ENCOUNTER — Telehealth: Payer: Self-pay

## 2024-02-11 NOTE — Telephone Encounter (Signed)
 Judith Hensley (Key: [id number])  Your information has been sent to [facility].

## 2024-02-15 ENCOUNTER — Other Ambulatory Visit: Payer: Self-pay

## 2024-02-15 MED ORDER — METFORMIN HCL ER 500 MG PO TB24: 500.0000 mg | ORAL_TABLET | Freq: Every day | ORAL | 0 refills | Status: DC

## 2024-02-23 ENCOUNTER — Ambulatory Visit (INDEPENDENT_AMBULATORY_CARE_PROVIDER_SITE_OTHER): Admitting: Family Medicine

## 2024-02-23 ENCOUNTER — Encounter: Payer: Self-pay | Admitting: Family Medicine

## 2024-02-23 VITALS — BP 130/80 | HR 53 | Temp 98.6°F | Ht 62.5 in | Wt 142.5 lb

## 2024-02-23 DIAGNOSIS — I10 Essential (primary) hypertension: Secondary | ICD-10-CM | POA: Diagnosis not present

## 2024-02-23 DIAGNOSIS — E785 Hyperlipidemia, unspecified: Secondary | ICD-10-CM | POA: Diagnosis not present

## 2024-02-23 DIAGNOSIS — K5730 Diverticulosis of large intestine without perforation or abscess without bleeding: Secondary | ICD-10-CM

## 2024-02-23 DIAGNOSIS — K582 Mixed irritable bowel syndrome: Secondary | ICD-10-CM | POA: Diagnosis not present

## 2024-02-23 DIAGNOSIS — E1165 Type 2 diabetes mellitus with hyperglycemia: Secondary | ICD-10-CM | POA: Diagnosis not present

## 2024-02-23 DIAGNOSIS — Z7984 Long term (current) use of oral hypoglycemic drugs: Secondary | ICD-10-CM

## 2024-02-23 DIAGNOSIS — N281 Cyst of kidney, acquired: Secondary | ICD-10-CM | POA: Insufficient documentation

## 2024-02-23 DIAGNOSIS — K219 Gastro-esophageal reflux disease without esophagitis: Secondary | ICD-10-CM

## 2024-02-23 LAB — URINALYSIS, ROUTINE W REFLEX MICROSCOPIC
Bilirubin Urine: NEGATIVE
Glucose, UA: NEGATIVE
Hgb urine dipstick: NEGATIVE
Ketones, ur: NEGATIVE
Leukocytes,Ua: NEGATIVE
Nitrite: NEGATIVE
Protein, ur: NEGATIVE
Specific Gravity, Urine: 1.01 (ref 1.001–1.035)
pH: 6 (ref 5.0–8.0)

## 2024-02-23 MED ORDER — LINACLOTIDE 72 MCG PO CAPS: 72.0000 ug | ORAL_CAPSULE | Freq: Every day | ORAL | 1 refills | Status: AC

## 2024-02-23 NOTE — Progress Notes (Signed)
 Patient Office Visit  Assessment & Plan:  Primary hypertension -     CBC with Differential/Platelet -     Comprehensive metabolic panel with GFR -     POCT urinalysis dipstick  Irritable bowel syndrome with both constipation and diarrhea -     linaCLOtide; Take 1 capsule (72 mcg total) by mouth daily before breakfast.  Dispense: 90 capsule; Refill: 1 -     Iron, TIBC and Ferritin Panel  Hyperlipidemia, unspecified hyperlipidemia type -     Lipid panel  Type 2 diabetes mellitus with hyperglycemia, without long-term current use of insulin (HCC) -     Hemoglobin A1c  Diverticulosis of colon  Gastroesophageal reflux disease without esophagitis -     Iron, TIBC and Ferritin Panel  Renal cyst, left   Test results were reviewed and analyzed as part of the medical decision making of this visit.  Reviewed previous [facility], urology notes, GI notes and previous lab work during the office visit. Recommend healthy diet i.e mediterranean/DASH diet, consistent exercise - 30 minutes 5 day per week, and gradual weight loss. Follow up on lab work and notify patient.  Continue healthy diet and consistent exercise.  Recommended that she increase protein in her diet as well as fiber.  Return in 3 months or sooner if necessary. No follow-ups on file.   Subjective:    Patient ID: Judith Hensley, female    DOB: 1946/09/26  Age: 78 y.o. MRN: [id number]  Chief Complaint  Patient presents with   Medical Management of Chronic Issues   Establish Care    HPI Establish medical care and chronic medical issues include hypertension, hyperlipidemia, type 2 diabetes, GERD, IBS with constipation and renal cyst.  Type 2 diabetes-previously controlled.  Denies diarrhea, peripheral swelling, hypoglycemia, excessive thirst, excessive urination, visual fluctuation, and fatigue.  Making an effort on diet control and exercise.  Has an up-to-date dilated eye exam.  Using medication as prescribed  without difficulty.  Patient's weight remains stable.  Patient does eat healthy for the most part and stays active.  Patient works out every day. Hyperlipidemia-denies unusual muscle aches. Cannot taking statins.  Aware of need for diet control, exercise and healthy eating.  Patient is aware not to consume grapefruit juice or pomegranate juice. GERD-patient has hx of Barrett's esophagitis (2021) and history of gastroesophageal reflux disease managed with daily Protonix 40 mg, which she reports as effective. She has not experienced any side effects from this medication.She reports no chronic sore throat, voice changes, hemoptysis, or hematochezia.  Pt does see Dr. Corey Dial GI re this.  Left renal cyst-patient is seeing Dr. Doyne Genin urology in [facility] regarding this.  Patient will have an ultrasound in the near future. HTN-using antihypertensive medication without difficulty.  Denies associated signs and symptoms including chest pain, shortness of breath, cough headache, peripheral swelling cramps spasms and palpitations.  Voices understanding of the potential for interference with blood pressure control with substances including high sodium intake, decongestions, herbal supplements weight loss supplements nutritional supplements.  Blood pressures at home are less than 140/90.   Has seen cardiology for aortic atherosclerosis (seen on CT scan) and will see cardiology end of this year.  Health Maintenance- pt has UTD mammogram, UTD colonoscopy (repeat 2026), UTD endoscopy per Dr. Corey Dial ( 2 years ago), UTD tetanus shot, UTD shingrix. Will need repeat EGD in 2026 per Dr. Corey Dial.   The 10-year ASCVD risk score (Arnett DK, et al., 2019) is: 45.9%  Past  Medical History:  Diagnosis Date   Diabetes mellitus    GERD (gastroesophageal reflux disease)    Hyperlipidemia    Hypertension    UTI (lower urinary tract infection)    Past Surgical History:  Procedure Laterality Date   ABDOMINAL HYSTERECTOMY      CESAREAN SECTION     CHOLECYSTECTOMY  11/10/2011   OOPHORECTOMY     TONSILLECTOMY     TUBAL LIGATION     Social History   Tobacco Use   Smoking status: Never   Smokeless tobacco: Never  Substance Use Topics   Alcohol use: No   Drug use: No   Family History  Problem Relation Age of Onset   Diabetes Mother    Hypertension Mother    Dementia Mother    Hypertension Father    Aneurysm Father    Diabetes Sister    Hypertension Sister    Colon cancer Sister    Breast cancer Sister    Stroke Sister    Hypertension Sister    Diabetes Mellitus II Brother    Hypertension Brother    Anxiety disorder Daughter    Ovarian cancer Neg Hx    Macular degeneration Neg Hx    Allergies  Allergen Reactions   Iodine-131 Anaphylaxis   Darvon [Propoxyphene Hcl] Other (See Comments)    Hallucinations    Erythromycin Hives   Lipitor [Atorvastatin] Other (See Comments)    Stroke like symptoms    Penicillins Hives   Sulfa Antibiotics    Valsartan    Zocor [Simvastatin] Other (See Comments)    Stroke like symptoms     ROS    Objective:    BP 130/80   Pulse (!) 53   Temp 98.6 F (37 C)   Ht 5' 2.5" (1.588 m)   Wt 142 lb 8 oz (64.6 kg)   SpO2 98%   BMI 25.65 kg/m  BP Readings from Last 3 Encounters:  02/23/24 130/80  03/10/20 126/73  02/20/17 (!) 176/96   Wt Readings from Last 3 Encounters:  02/23/24 142 lb 8 oz (64.6 kg)  03/10/20 158 lb (71.7 kg)  02/20/17 184 lb (83.5 kg)    Physical Exam Vitals and nursing note reviewed.  Constitutional:      Appearance: Normal appearance.  HENT:     Head: Normocephalic.     Right Ear: Tympanic membrane, ear canal and external ear normal.     Left Ear: Tympanic membrane, ear canal and external ear normal.  Eyes:     Extraocular Movements: Extraocular movements intact.     Conjunctiva/sclera: Conjunctivae normal.     Pupils: Pupils are equal, round, and reactive to light.  Cardiovascular:     Rate and Rhythm: Normal rate  and regular rhythm.     Heart sounds: Normal heart sounds.  Pulmonary:     Effort: Pulmonary effort is normal.     Breath sounds: Normal breath sounds.  Musculoskeletal:     Right lower leg: No edema.     Left lower leg: No edema.  Neurological:     General: No focal deficit present.     Mental Status: She is alert and oriented to person, place, and time.  Psychiatric:        Mood and Affect: Mood normal.        Behavior: Behavior normal.        Thought Content: Thought content normal.        Judgment: Judgment normal.      No  results found for any visits on 02/23/24.

## 2024-02-24 LAB — CBC WITH DIFFERENTIAL/PLATELET
Absolute Lymphocytes: 2307 {cells}/uL (ref 850–3900)
Absolute Monocytes: 360 {cells}/uL (ref 200–950)
Basophils Absolute: 47 {cells}/uL (ref 0–200)
Basophils Relative: 0.8 %
Eosinophils Absolute: 148 {cells}/uL (ref 15–500)
Eosinophils Relative: 2.5 %
HCT: 36.5 % (ref 35.0–45.0)
Hemoglobin: 12.1 g/dL (ref 11.7–15.5)
MCH: 29.2 pg (ref 27.0–33.0)
MCHC: 33.2 g/dL (ref 32.0–36.0)
MCV: 88 fL (ref 80.0–100.0)
MPV: 10.8 fL (ref 7.5–12.5)
Monocytes Relative: 6.1 %
Neutro Abs: 3039 {cells}/uL (ref 1500–7800)
Neutrophils Relative %: 51.5 %
Platelets: 273 10*3/uL (ref 140–400)
RBC: 4.15 10*6/uL (ref 3.80–5.10)
RDW: 14 % (ref 11.0–15.0)
Total Lymphocyte: 39.1 %
WBC: 5.9 10*3/uL (ref 3.8–10.8)

## 2024-02-24 LAB — COMPREHENSIVE METABOLIC PANEL WITH GFR
AG Ratio: 1.9 (calc) (ref 1.0–2.5)
ALT: 9 U/L (ref 6–29)
AST: 15 U/L (ref 10–35)
Albumin: 4.5 g/dL (ref 3.6–5.1)
Alkaline phosphatase (APISO): 59 U/L (ref 37–153)
BUN: 9 mg/dL (ref 7–25)
CO2: 28 mmol/L (ref 20–32)
Calcium: 9.5 mg/dL (ref 8.6–10.4)
Chloride: 102 mmol/L (ref 98–110)
Creat: 0.79 mg/dL (ref 0.60–1.00)
Globulin: 2.4 g/dL (ref 1.9–3.7)
Glucose, Bld: 89 mg/dL (ref 65–99)
Potassium: 4.1 mmol/L (ref 3.5–5.3)
Sodium: 139 mmol/L (ref 135–146)
Total Bilirubin: 0.4 mg/dL (ref 0.2–1.2)
Total Protein: 6.9 g/dL (ref 6.1–8.1)
eGFR: 77 mL/min/{1.73_m2} (ref >=60)

## 2024-02-24 LAB — HEMOGLOBIN A1C
Hgb A1c MFr Bld: 6.5 % — ABNORMAL HIGH (ref <5.7)
Mean Plasma Glucose: 140 mg/dL
eAG (mmol/L): 7.7 mmol/L

## 2024-02-24 LAB — LIPID PANEL
Cholesterol: 242 mg/dL — ABNORMAL HIGH (ref <200)
HDL: 93 mg/dL (ref >=50)
LDL Cholesterol (Calc): 132 mg/dL — ABNORMAL HIGH
Non-HDL Cholesterol (Calc): 149 mg/dL — ABNORMAL HIGH (ref <130)
Total CHOL/HDL Ratio: 2.6 (calc) (ref <5.0)
Triglycerides: 76 mg/dL (ref <150)

## 2024-02-24 LAB — IRON,TIBC AND FERRITIN PANEL
%SAT: 18 % (ref 16–45)
Ferritin: 9 ng/mL — ABNORMAL LOW (ref 16–288)
Iron: 75 ug/dL (ref 45–160)
TIBC: 413 ug/dL (ref 250–450)

## 2024-03-01 ENCOUNTER — Telehealth: Payer: Self-pay

## 2024-03-01 NOTE — Telephone Encounter (Signed)
 Copied from CRM [id number]. Topic: General - Other >> Mar 01, 2024  3:00 PM Ja-Kwan M wrote: Reason for CRM: Patient requests that Marlin Simmonds return her call at [phone]

## 2024-03-28 ENCOUNTER — Other Ambulatory Visit (HOSPITAL_COMMUNITY): Payer: Self-pay

## 2024-04-19 DIAGNOSIS — K5900 Constipation, unspecified: Secondary | ICD-10-CM | POA: Insufficient documentation

## 2024-05-05 ENCOUNTER — Encounter: Payer: Self-pay | Admitting: Obstetrics and Gynecology

## 2024-05-05 LAB — HM MAMMOGRAPHY

## 2024-05-24 ENCOUNTER — Ambulatory Visit: Admitting: Family Medicine

## 2024-05-24 ENCOUNTER — Encounter: Payer: Self-pay | Admitting: Family Medicine

## 2024-05-24 VITALS — BP 124/86 | HR 51 | Temp 98.7°F | Ht 62.5 in | Wt 140.0 lb

## 2024-05-24 DIAGNOSIS — I10 Essential (primary) hypertension: Secondary | ICD-10-CM

## 2024-05-24 DIAGNOSIS — K582 Mixed irritable bowel syndrome: Secondary | ICD-10-CM | POA: Diagnosis not present

## 2024-05-24 DIAGNOSIS — E785 Hyperlipidemia, unspecified: Secondary | ICD-10-CM | POA: Diagnosis not present

## 2024-05-24 DIAGNOSIS — E1165 Type 2 diabetes mellitus with hyperglycemia: Secondary | ICD-10-CM | POA: Diagnosis not present

## 2024-05-24 DIAGNOSIS — K219 Gastro-esophageal reflux disease without esophagitis: Secondary | ICD-10-CM

## 2024-05-24 NOTE — Progress Notes (Signed)
 Patient Office Visit  Assessment & Plan:  Type 2 diabetes mellitus with hyperglycemia, without long-term current use of insulin (HCC) -     Microalbumin / creatinine urine ratio -     Hemoglobin A1c  Irritable bowel syndrome with both constipation and diarrhea  Primary hypertension -     CBC with Differential/Platelet -     Comprehensive metabolic panel with GFR  Hyperlipidemia, unspecified hyperlipidemia type -     Lipid panel  Gastroesophageal reflux disease without esophagitis   Assessment and Plan    Type 2 diabetes mellitus without complications Type 2 diabetes is well-managed with no complications. She adheres to a healthy lifestyle and management plan. - Order microalbumin test for proteinuria. - Order HbA1c test for glycemic control. - Order comprehensive metabolic panel for renal function and glucose levels.          Return in about 4 months (around 09/24/2024), or if symptoms worsen or fail to improve.   Subjective:    Patient ID: Judith Hensley, female    DOB: 1946/10/03  Age: 78 y.o. MRN: [id number]  No chief complaint on file.   HPI Discussed the use of AI scribe software for clinical note transcription with the patient, who gave verbal consent to proceed.  History of Present Illness        Judith Hensley is a 78 year old female with type 2 diabetes who presents for a routine follow-up visit re chronic medical issues.   She reports no symptoms of neuropathy. She has provided a urine sample for microalbumin testing and is aware of the importance of monitoring her A1c levels. She has UTD eye exam   She maintains a daily exercise routine, attending the gym every day, and emphasizes the importance of physical activity for both physical and mental health. She encourages others in her community to stay active and has seen positive results among her peers, with several individuals losing weight through consistent exercise. She cooks at home regularly and is  mindful of her diet, avoiding eating out due to past stomach issues. IBS/GERD- patient does see Dr. Marvis in [facility] re this. Patient has UTD colonoscopy HTN-using antihypertensive medication without difficulty.  Denies associated signs and symptoms including chest pain, shortness of breath, cough headache, peripheral swelling cramps spasms and palpitations.  Voices understanding of the potential for interference with blood pressure control with substances including high sodium intake, decongestions, herbal supplements weight loss supplements nutritional supplements.  Blood pressures at home are less than 140/90.   Hyperlipidemia-cannot tolerate statin therapy.  Aware of need for diet control, exercise and healthy eating.   FH thyroid cancer- She mentions her daughter, Rowena, who is undergoing surgery for thyroid cancer. Judith Hensley was diagnosed after she noticed her daughter's weight gain and suggested she see an endocrinologist. Judith Hensley's sugar levels were elevated, and she was started on medication to manage her blood sugar before surgery. She expresses concern about her daughter's health but respects her daughter's wish to have her husband present during the surgery. Physical Exam CHEST: Lungs clear to auscultation. EXTREMITIES: Feet sensation intact. Results LABS HbA1c: 6 range   Assessment & Plan Type 2 diabetes mellitus without complications Type 2 diabetes is well-managed with no complications. She adheres to a healthy lifestyle and management plan. - Order microalbumin test for proteinuria. - Order HbA1c test for glycemic control. - Order comprehensive metabolic panel for renal function and glucose levels.    The 10-year ASCVD risk score (Arnett DK, et al., 2019) is: 45.7%  Past Medical History:  Diagnosis Date   Diabetes mellitus    GERD (gastroesophageal reflux disease)    Hyperlipidemia    Hypertension    UTI (lower urinary tract infection)    Past Surgical History:  Procedure  Laterality Date   ABDOMINAL HYSTERECTOMY     CESAREAN SECTION     CHOLECYSTECTOMY  11/10/2011   OOPHORECTOMY     TONSILLECTOMY     TUBAL LIGATION     Social History   Tobacco Use   Smoking status: Never   Smokeless tobacco: Never  Substance Use Topics   Alcohol use: No   Drug use: No   Family History  Problem Relation Age of Onset   Diabetes Mother    Hypertension Mother    Dementia Mother    Hypertension Father    Aneurysm Father    Diabetes Sister    Hypertension Sister    Colon cancer Sister    Breast cancer Sister    Stroke Sister    Hypertension Sister    Diabetes Mellitus II Brother    Hypertension Brother    Anxiety disorder Daughter    Thyroid cancer Daughter    Ovarian cancer Neg Hx    Macular degeneration Neg Hx    Allergies  Allergen Reactions   Iodine-131 Anaphylaxis   Darvon [Propoxyphene Hcl] Other (See Comments)    Hallucinations    Erythromycin Hives   Lipitor [Atorvastatin] Other (See Comments)    Stroke like symptoms    Penicillins Hives   Sulfa Antibiotics    Valsartan    Zocor [Simvastatin] Other (See Comments)    Stroke like symptoms     ROS    Objective:    BP 124/86   Pulse (!) 51   Temp 98.7 F (37.1 C)   Ht 5' 2.5 (1.588 m)   Wt 140 lb (63.5 kg)   SpO2 99%   BMI 25.20 kg/m  BP Readings from Last 3 Encounters:  05/24/24 124/86  02/23/24 130/80  03/10/20 126/73   Wt Readings from Last 3 Encounters:  05/24/24 140 lb (63.5 kg)  02/23/24 142 lb 8 oz (64.6 kg)  03/10/20 158 lb (71.7 kg)    Physical Exam Vitals and nursing note reviewed.  Constitutional:      Appearance: Normal appearance.  HENT:     Head: Normocephalic.     Right Ear: Tympanic membrane, ear canal and external ear normal.     Left Ear: Tympanic membrane, ear canal and external ear normal.  Eyes:     Extraocular Movements: Extraocular movements intact.     Pupils: Pupils are equal, round, and reactive to light.  Cardiovascular:     Rate and  Rhythm: Normal rate and regular rhythm.     Heart sounds: Normal heart sounds.  Pulmonary:     Effort: Pulmonary effort is normal.     Breath sounds: Normal breath sounds.  Musculoskeletal:     Right lower leg: No edema.     Left lower leg: No edema.  Skin:    Comments: Feet- monofilament exam performed and patient had normal exam     Neurological:     General: No focal deficit present.     Mental Status: She is alert and oriented to person, place, and time.  Psychiatric:        Mood and Affect: Mood normal.        Behavior: Behavior normal.      No results found for any visits on 05/24/24.

## 2024-05-25 ENCOUNTER — Ambulatory Visit: Payer: Self-pay | Admitting: Family Medicine

## 2024-05-25 LAB — MICROALBUMIN / CREATININE URINE RATIO
Creatinine, Urine: 25 mg/dL (ref 20–275)
Microalb, Ur: <0.2 mg/dL

## 2024-05-25 LAB — HEMOGLOBIN A1C
Hgb A1c MFr Bld: 6.2 % — ABNORMAL HIGH (ref <5.7)
Mean Plasma Glucose: 131 mg/dL
eAG (mmol/L): 7.3 mmol/L

## 2024-05-25 LAB — COMPREHENSIVE METABOLIC PANEL WITH GFR
AG Ratio: 1.6 (calc) (ref 1.0–2.5)
ALT: 11 U/L (ref 6–29)
AST: 16 U/L (ref 10–35)
Albumin: 4.3 g/dL (ref 3.6–5.1)
Alkaline phosphatase (APISO): 58 U/L (ref 37–153)
BUN: 14 mg/dL (ref 7–25)
CO2: 27 mmol/L (ref 20–32)
Calcium: 9.7 mg/dL (ref 8.6–10.4)
Chloride: 104 mmol/L (ref 98–110)
Creat: 0.9 mg/dL (ref 0.60–1.00)
Globulin: 2.7 g/dL (ref 1.9–3.7)
Glucose, Bld: 97 mg/dL (ref 65–99)
Potassium: 4.9 mmol/L (ref 3.5–5.3)
Sodium: 140 mmol/L (ref 135–146)
Total Bilirubin: 0.4 mg/dL (ref 0.2–1.2)
Total Protein: 7 g/dL (ref 6.1–8.1)
eGFR: 66 mL/min/1.73m2 (ref >=60)

## 2024-05-25 LAB — CBC WITH DIFFERENTIAL/PLATELET
Absolute Lymphocytes: 2337 {cells}/uL (ref 850–3900)
Absolute Monocytes: 310 {cells}/uL (ref 200–950)
Basophils Absolute: 50 {cells}/uL (ref 0–200)
Basophils Relative: 0.8 %
Eosinophils Absolute: 180 {cells}/uL (ref 15–500)
Eosinophils Relative: 2.9 %
HCT: 38.5 % (ref 35.0–45.0)
Hemoglobin: 12.1 g/dL (ref 11.7–15.5)
MCH: 28.8 pg (ref 27.0–33.0)
MCHC: 31.4 g/dL — ABNORMAL LOW (ref 32.0–36.0)
MCV: 91.7 fL (ref 80.0–100.0)
MPV: 10.6 fL (ref 7.5–12.5)
Monocytes Relative: 5 %
Neutro Abs: 3323 {cells}/uL (ref 1500–7800)
Neutrophils Relative %: 53.6 %
Platelets: 297 Thousand/uL (ref 140–400)
RBC: 4.2 Million/uL (ref 3.80–5.10)
RDW: 13.8 % (ref 11.0–15.0)
Total Lymphocyte: 37.7 %
WBC: 6.2 Thousand/uL (ref 3.8–10.8)

## 2024-05-25 LAB — LIPID PANEL
Cholesterol: 243 mg/dL — ABNORMAL HIGH (ref <200)
HDL: 89 mg/dL (ref >=50)
LDL Cholesterol (Calc): 137 mg/dL — ABNORMAL HIGH
Non-HDL Cholesterol (Calc): 154 mg/dL — ABNORMAL HIGH (ref <130)
Total CHOL/HDL Ratio: 2.7 (calc) (ref <5.0)
Triglycerides: 76 mg/dL (ref <150)

## 2024-05-25 NOTE — Progress Notes (Signed)
 DPR reviewed, left vm on identifiable vm box.

## 2024-06-10 ENCOUNTER — Ambulatory Visit: Admitting: Family Medicine

## 2024-08-18 ENCOUNTER — Other Ambulatory Visit: Payer: Self-pay | Admitting: Family Medicine

## 2024-09-27 ENCOUNTER — Ambulatory Visit: Admitting: Family Medicine

## 2024-10-21 ENCOUNTER — Ambulatory Visit (INDEPENDENT_AMBULATORY_CARE_PROVIDER_SITE_OTHER): Admitting: Family Medicine

## 2024-10-21 ENCOUNTER — Encounter: Payer: Self-pay | Admitting: Family Medicine

## 2024-10-21 VITALS — BP 118/80 | HR 52 | Temp 98.5°F | Ht 62.5 in | Wt 147.0 lb

## 2024-10-21 DIAGNOSIS — N281 Cyst of kidney, acquired: Secondary | ICD-10-CM | POA: Diagnosis not present

## 2024-10-21 DIAGNOSIS — R252 Cramp and spasm: Secondary | ICD-10-CM

## 2024-10-21 DIAGNOSIS — G4709 Other insomnia: Secondary | ICD-10-CM | POA: Diagnosis not present

## 2024-10-21 DIAGNOSIS — E1165 Type 2 diabetes mellitus with hyperglycemia: Secondary | ICD-10-CM | POA: Diagnosis not present

## 2024-10-21 DIAGNOSIS — Z79899 Other long term (current) drug therapy: Secondary | ICD-10-CM | POA: Diagnosis not present

## 2024-10-21 DIAGNOSIS — Z7984 Long term (current) use of oral hypoglycemic drugs: Secondary | ICD-10-CM | POA: Diagnosis not present

## 2024-10-21 DIAGNOSIS — E785 Hyperlipidemia, unspecified: Secondary | ICD-10-CM | POA: Diagnosis not present

## 2024-10-21 NOTE — Progress Notes (Signed)
 "  Patient Office Visit  Assessment & Plan:  Type 2 diabetes mellitus with hyperglycemia, without long-term current use of insulin (HCC) -     Hemoglobin A1c  Hyperlipidemia, unspecified hyperlipidemia type -     Lipid panel  Taking a statin medication  Bilateral leg cramps -     CBC with Differential/Platelet -     Comprehensive metabolic panel with GFR -     TSH -     CK  Other insomnia  Renal cyst, left -     Urinalysis, Routine w reflex microscopic   Assessment and Plan    Hyperlipidemia with statin intolerance on nonstatin therapy LDL cholesterol requires reduction, ideally below 100 mg/dL, target under 70 mg/dL. Cardiologist prescribed penventoic acid without detailed explanation. Insurance reduced cost from $1500 to $100. Oral medication preferred over injections. - Continue penventoic acid as prescribed by cardiologist. - Discuss with cardiologist the rationale for medication choice and target LDL levels.  Type 2 diabetes mellitus Managed with extended-release metformin  (XR), well tolerated. - Continue extended-release metformin  (XR). - Ordered A1c test to monitor diabetes control.  Bilateral leg cramps Intermittent bilateral leg cramps, more frequent, not related to statin use. Occur at night, relieved by stretching. - Ordered CK test to assess muscle health.  Insomnia Chronic insomnia with increasing frequency. Previous melatonin use. Discussed doxepin for sleep, contingent on insurance coverage. Prefers to avoid medication if possible. - Consider doxepin 3 mg at night if insurance covers and insomnia significantly impacts quality of life.  Left renal cyst Identified on ultrasound, common with aging, not concerning per urologist. - Continue monitoring as per urologist's advice.          Return in about 3 months (around 01/19/2025), or if symptoms worsen or fail to improve.   Subjective:    Patient ID: Judith Hensley, female    DOB: [birth year]  Age: 78  y.o. MRN: [id number]  Chief Complaint  Patient presents with   Medical Management of Chronic Issues    4 month f/u. Concerned about new medication cardiology prescribed (Bempedoic Acid)   Insomnia    Pt states she will stay up for days and then just crash.    Insomnia   Discussed the use of AI scribe software for clinical note transcription with the patient, who gave verbal consent to proceed.  History of Present Illness        History of Present Illness Judith Hensley is a 78 year old female with hyperlipidemia and type 2 diabetes who presents for medication management and concerns about new medications.  She is concerned about a new medication prescribed by her cardiologist to manage her cholesterol levels. She has been on bempedoic acid, at a dose of 180 mg, for two weeks after her cardiologist in [facility] prescribed it because she cannot tolerate statins. She is unsure of the specific goals set by her cardiologist. She has not been able to tolerate statins in the past. Cardiologist told her her cholesterol needs to be better but did not tell her what the LDL goal should be  She has a history of type 2 diabetes and is currently taking metformin  XR, which she tolerates well. She was initially confused about a mention of 'modified' metformin  but confirmed she is on the extended-release version, which she has been taking consistently. She works out consistently and eats a healthy diet  She experiences severe cramps, particularly at night, which occur more frequently. She drinks a gallon of water daily and has  tried remedies like pickle juice and mustard, but the cramps persist. She is unsure if these are related to her exercise routine or hydration status.  She reports ongoing sleep disturbances, which have become more frequent. She has tried melatonin in the past without success and is currently not taking any medication for sleep.  She mentions a past finding of a kidney cyst, which was  evaluated by a urologist and deemed not concerning. No current urinary symptoms such as burning or hematuria. This was reviewed today. patient states she cannot ask the specialists questions like she can ask me  Results Radiology Renal ultrasound (02/2024): Simple left renal cyst without suspicious features  Assessment and Plan Hyperlipidemia with statin intolerance on nonstatin therapy LDL cholesterol requires reduction, ideally below 100 mg/dL, target under 70 mg/dL. Cardiologist prescribed penventoic acid without detailed explanation. Insurance reduced cost from $1500 to $100. Oral medication preferred over injections. - Continue penventoic acid as prescribed by cardiologist. - Discuss with cardiologist the rationale for medication choice and target LDL levels.  Type 2 diabetes mellitus Managed with extended-release metformin  (XR), well tolerated. - Continue extended-release metformin  (XR). - Ordered A1c test to monitor diabetes control.  Bilateral leg cramps Intermittent bilateral leg cramps, more frequent, not related to statin use. Occur at night, relieved by stretching. - Ordered CK test to assess muscle health.  Insomnia Chronic insomnia with increasing frequency. Previous melatonin use. Discussed doxepin for sleep, contingent on insurance coverage. Prefers to avoid medication if possible. - Consider doxepin 3 mg at night if insurance covers and insomnia significantly impacts quality of life.  Patient declined medication at this time.  Left renal cyst Identified on ultrasound, common with aging, not concerning per urologist. - Continue monitoring as per urologist's advice. IMPRESSION:  1. No hydronephrosis.  2. 1.6 cm complex cyst lower pole left kidney. MRI could be  considered for further evaluation.  Electronically Signed    By: Luke Bun M.D.    On: 03/25/2024 23:53   The 10-year ASCVD risk score (Arnett DK, et al., 2019) is: 45.6%  Past Medical History:   Diagnosis Date   Diabetes mellitus    GERD (gastroesophageal reflux disease)    Hyperlipidemia    Hypertension    UTI (lower urinary tract infection)    Past Surgical History:  Procedure Laterality Date   ABDOMINAL HYSTERECTOMY     CESAREAN SECTION     CHOLECYSTECTOMY  11/10/2011   OOPHORECTOMY     TONSILLECTOMY     TUBAL LIGATION     Social History[1] Family History  Problem Relation Age of Onset   Diabetes Mother    Hypertension Mother    Dementia Mother    Hypertension Father    Aneurysm Father    Diabetes Sister    Hypertension Sister    Colon cancer Sister    Breast cancer Sister    Stroke Sister    Hypertension Sister    Diabetes Mellitus II Brother    Hypertension Brother    Anxiety disorder Daughter    Thyroid cancer Daughter    Ovarian cancer Neg Hx    Macular degeneration Neg Hx    Allergies[2]  Review of Systems  Psychiatric/Behavioral:  The patient has insomnia.       Objective:    BP 118/80   Pulse (!) 52   Temp 98.5 F (36.9 C)   Ht 5' 2.5 (1.588 m)   Wt 147 lb (66.7 kg)   SpO2 96%   BMI 26.46 kg/m  BP Readings from Last 3 Encounters:  10/21/24 118/80  05/24/24 124/86  02/23/24 130/80   Wt Readings from Last 3 Encounters:  10/21/24 147 lb (66.7 kg)  05/24/24 140 lb (63.5 kg)  02/23/24 142 lb 8 oz (64.6 kg)    Physical Exam Vitals and nursing note reviewed.  Constitutional:      Appearance: Normal appearance.  HENT:     Head: Normocephalic.     Right Ear: Tympanic membrane, ear canal and external ear normal.     Left Ear: Tympanic membrane, ear canal and external ear normal.  Eyes:     Extraocular Movements: Extraocular movements intact.     Conjunctiva/sclera: Conjunctivae normal.     Pupils: Pupils are equal, round, and reactive to light.  Cardiovascular:     Rate and Rhythm: Normal rate and regular rhythm.     Heart sounds: Normal heart sounds.  Pulmonary:     Effort: Pulmonary effort is normal.     Breath sounds:  Normal breath sounds.  Musculoskeletal:     Right lower leg: No edema.     Left lower leg: No edema.  Neurological:     General: No focal deficit present.     Mental Status: She is alert and oriented to person, place, and time.  Psychiatric:        Mood and Affect: Mood normal.        Behavior: Behavior normal.        Thought Content: Thought content normal.        Judgment: Judgment normal.      No results found for any visits on 10/21/24.          [1]  Social History Tobacco Use   Smoking status: Never   Smokeless tobacco: Never  Substance Use Topics   Alcohol use: No   Drug use: No  [2]  Allergies Allergen Reactions   Iodine-131 Anaphylaxis   Darvon [Propoxyphene Hcl] Other (See Comments)    Hallucinations    Erythromycin Hives   Lipitor [Atorvastatin] Other (See Comments)    Stroke like symptoms    Penicillins Hives   Sulfa Antibiotics    Valsartan    Zocor [Simvastatin] Other (See Comments)    Stroke like symptoms    "

## 2024-10-22 LAB — URINALYSIS, ROUTINE W REFLEX MICROSCOPIC
Bilirubin Urine: NEGATIVE
Glucose, UA: NEGATIVE
Hgb urine dipstick: NEGATIVE
Ketones, ur: NEGATIVE
Leukocytes,Ua: NEGATIVE
Nitrite: NEGATIVE
Protein, ur: NEGATIVE
Specific Gravity, Urine: 1.022 (ref 1.001–1.035)
pH: 7.5 (ref 5.0–8.0)

## 2024-10-23 ENCOUNTER — Ambulatory Visit: Payer: Self-pay | Admitting: Family Medicine

## 2024-10-24 ENCOUNTER — Telehealth: Payer: Self-pay

## 2024-10-24 NOTE — Progress Notes (Signed)
 Iron panel sent to [facility] to add on.

## 2024-10-24 NOTE — Telephone Encounter (Signed)
 Copied from CRM [id number]. Topic: Clinical - Lab/Test Results >> Oct 24, 2024  2:53 PM Antony RAMAN wrote: Reason for CRM: wants recent labwork mailed to her address on file

## 2024-10-24 NOTE — Progress Notes (Signed)
 Lvm for pt to return call

## 2024-10-24 NOTE — Progress Notes (Signed)
"  Pt informed and verbalized understanding.   "

## 2024-10-25 LAB — COMPREHENSIVE METABOLIC PANEL WITH GFR
AG Ratio: 1.6 (calc) (ref 1.0–2.5)
ALT: 8 U/L (ref 6–29)
AST: 16 U/L (ref 10–35)
Albumin: 4.1 g/dL (ref 3.6–5.1)
Alkaline phosphatase (APISO): 54 U/L (ref 37–153)
BUN: 12 mg/dL (ref 7–25)
CO2: 29 mmol/L (ref 20–32)
Calcium: 9.6 mg/dL (ref 8.6–10.4)
Chloride: 105 mmol/L (ref 98–110)
Creat: 0.86 mg/dL (ref 0.60–1.00)
Globulin: 2.6 g/dL (ref 1.9–3.7)
Glucose, Bld: 102 mg/dL — ABNORMAL HIGH (ref 65–99)
Potassium: 4.7 mmol/L (ref 3.5–5.3)
Sodium: 141 mmol/L (ref 135–146)
Total Bilirubin: 0.4 mg/dL (ref 0.2–1.2)
Total Protein: 6.7 g/dL (ref 6.1–8.1)
eGFR: 70 mL/min/1.73m2

## 2024-10-25 LAB — IRON,TIBC AND FERRITIN PANEL
%SAT: 13 % — ABNORMAL LOW (ref 16–45)
Ferritin: 13 ng/mL — ABNORMAL LOW (ref 16–288)
Iron: 60 ug/dL (ref 45–160)
TIBC: 454 ug/dL — ABNORMAL HIGH (ref 250–450)

## 2024-10-25 LAB — LIPID PANEL
Cholesterol: 173 mg/dL
HDL: 74 mg/dL
LDL Cholesterol (Calc): 83 mg/dL
Non-HDL Cholesterol (Calc): 99 mg/dL
Total CHOL/HDL Ratio: 2.3 (calc)
Triglycerides: 80 mg/dL

## 2024-10-25 LAB — CBC WITH DIFFERENTIAL/PLATELET
Absolute Lymphocytes: 1865 {cells}/uL (ref 850–3900)
Absolute Monocytes: 338 {cells}/uL (ref 200–950)
Basophils Absolute: 58 {cells}/uL (ref 0–200)
Basophils Relative: 0.8 %
Eosinophils Absolute: 187 {cells}/uL (ref 15–500)
Eosinophils Relative: 2.6 %
HCT: 36.4 % (ref 35.9–46.0)
Hemoglobin: 11.6 g/dL — ABNORMAL LOW (ref 11.7–15.5)
MCH: 28.4 pg (ref 27.0–33.0)
MCHC: 31.9 g/dL (ref 31.6–35.4)
MCV: 89.2 fL (ref 81.4–101.7)
MPV: 9.9 fL (ref 7.5–12.5)
Monocytes Relative: 4.7 %
Neutro Abs: 4752 {cells}/uL (ref 1500–7800)
Neutrophils Relative %: 66 %
Platelets: 462 Thousand/uL — ABNORMAL HIGH (ref 140–400)
RBC: 4.08 Million/uL (ref 3.80–5.10)
RDW: 13.6 % (ref 11.0–15.0)
Total Lymphocyte: 25.9 %
WBC: 7.2 Thousand/uL (ref 3.8–10.8)

## 2024-10-25 LAB — CK: Total CK: 87 U/L (ref 18–225)

## 2024-10-25 LAB — HEMOGLOBIN A1C
Hgb A1c MFr Bld: 6.4 % — ABNORMAL HIGH
Mean Plasma Glucose: 137 mg/dL
eAG (mmol/L): 7.6 mmol/L

## 2024-10-25 LAB — TEST AUTHORIZATION

## 2024-10-25 LAB — TSH: TSH: 0.89 m[IU]/L (ref 0.40–4.50)

## 2024-11-03 ENCOUNTER — Other Ambulatory Visit: Payer: Self-pay | Admitting: Family Medicine

## 2024-11-15 ENCOUNTER — Other Ambulatory Visit: Payer: Self-pay | Admitting: Family Medicine

## 2024-11-24 ENCOUNTER — Other Ambulatory Visit: Payer: Self-pay | Admitting: Family Medicine

## 2024-11-24 NOTE — Telephone Encounter (Signed)
 Copied from CRM #[id number]. Topic: Clinical - Medication Refill >> Nov 24, 2024  9:47 AM Viola F wrote: Medication: metFORMIN  (GLUCOPHAGE -XR) 500 MG 24 hr tablet [[id number]] lisinopril (ZESTRIL) 10 MG tablet [[id number]] pantoprazole (PROTONIX) 20 MG tablet [[id number]] ezetimibe  (ZETIA ) 10 MG tablet [[id number]]  Has the patient contacted their pharmacy? Yes (Agent: If no, request that the patient contact the pharmacy for the refill. If patient does not wish to contact the pharmacy document the reason why and proceed with request.) (Agent: If yes, when and what did the pharmacy advise?)  This is the patient's preferred pharmacy:   [id number], [place] - [address] AT [facility] [address] [place] [address] Phone: [phone] Fax: [phone]  Is this the correct pharmacy for this prescription? Yes If no, delete pharmacy and type the correct one.   Has the prescription been filled recently? Yes  Is the patient out of the medication? Yes, been out for 2 days   Has the patient been seen for an appointment in the last year OR does the patient have an upcoming appointment? Yes  Can we respond through MyChart? No  Agent: Please be advised that Rx refills may take up to 3 business days. We ask that you follow-up with your pharmacy.

## 2024-11-25 ENCOUNTER — Other Ambulatory Visit: Payer: Self-pay | Admitting: Family Medicine

## 2024-11-25 MED ORDER — LISINOPRIL 10 MG PO TABS: 10.0000 mg | ORAL_TABLET | Freq: Two times a day (BID) | ORAL | 1 refills | Status: AC

## 2024-11-25 MED ORDER — PANTOPRAZOLE SODIUM 20 MG PO TBEC: 40.0000 mg | DELAYED_RELEASE_TABLET | Freq: Two times a day (BID) | ORAL | 1 refills | Status: AC

## 2024-11-25 NOTE — Telephone Encounter (Signed)
 Called [facility], spoke with pharmacy staff in regard to refills for metFORMIN  (GLUCOPHAGE -XR) 500 MG andezetimibe (ZETIA ) 10 MG tablet. Staff states that patient picked up refills already and only need refills for lisinopril  (ZESTRIL ) 10 MG tablet and pantoprazole  (PROTONIX ) 20 MG tablet.

## 2024-11-25 NOTE — Telephone Encounter (Signed)
 Need to call pharmacy - Ezetimibe  and Metformin  should not need a refill.

## 2024-11-25 NOTE — Telephone Encounter (Signed)
 Too soon for refill.  Requested Prescriptions  Pending Prescriptions Disp Refills   metFORMIN  (GLUCOPHAGE -XR) 500 MG 24 hr tablet 90 tablet 1     Endocrinology:  Diabetes - Biguanides Failed - 11/25/2024 11:53 AM      Failed - B12 Level in normal range and within 720 days    No results found for: VITAMINB12       Passed - Cr in normal range and within 360 days    Creat  Date Value Ref Range Status  10/21/2024 0.86 0.60 - 1.00 mg/dL Final   Creatinine, Urine  Date Value Ref Range Status  05/24/2024 25 20 - 275 mg/dL Final         Passed - HBA1C is between 0 and 7.9 and within 180 days    Hgb A1c MFr Bld  Date Value Ref Range Status  10/21/2024 6.4 (H) <5.7 % Final    Comment:    For someone without known diabetes, a hemoglobin  A1c value between 5.7% and 6.4% is consistent with prediabetes and should be confirmed with a  follow-up test. . For someone with known diabetes, a value <7% indicates that their diabetes is well controlled. A1c targets should be individualized based on duration of diabetes, age, comorbid conditions, and other considerations. . This assay result is consistent with an increased risk of diabetes. . Currently, no consensus exists regarding use of hemoglobin A1c for diagnosis of diabetes for children. .          Passed - eGFR in normal range and within 360 days    GFR calc Af Amer  Date Value Ref Range Status  07/21/2011 >60 >60 mL/min Final    Comment:           The eGFR has been calculated using the MDRD equation. This calculation has not been validated in all clinical situations. eGFR's persistently <60 mL/min signify possible Chronic Kidney Disease.   GFR calc non Af Amer  Date Value Ref Range Status  07/21/2011 >60 >60 mL/min Final   eGFR  Date Value Ref Range Status  10/21/2024 70 > OR = 60 mL/min/1.41m2 Final         Passed - Valid encounter within last 6 months    Recent Outpatient Visits           1 month ago Type  2 diabetes mellitus with hyperglycemia, without long-term current use of insulin (HCC)   [facility] [place] [facility] Aletha Bene, MD   6 months ago Type 2 diabetes mellitus with hyperglycemia, without long-term current use of insulin (HCC)   [facility] [facility] Aletha Bene, MD   9 months ago Primary hypertension   [place] [facility] Aletha Bene, MD              Passed - CBC within normal limits and completed in the last 12 months    WBC  Date Value Ref Range Status  10/21/2024 7.2 3.8 - 10.8 Thousand/uL Final   RBC  Date Value Ref Range Status  10/21/2024 4.08 3.80 - 5.10 Million/uL Final   Hemoglobin  Date Value Ref Range Status  10/21/2024 11.6 (L) 11.7 - 15.5 g/dL Final   HCT  Date Value Ref Range Status  10/21/2024 36.4 35.9 - 46.0 % Final   MCHC  Date Value Ref Range Status  10/21/2024 31.9 31.6 - 35.4 g/dL Final   [facility]  Date Value Ref Range Status  10/21/2024 28.4 27.0 -  33.0 pg Final   MCV  Date Value Ref Range Status  10/21/2024 89.2 81.4 - 101.7 fL Final   No results found for: PLTCOUNTKUC, LABPLAT, POCPLA RDW  Date Value Ref Range Status  10/21/2024 13.6 11.0 - 15.0 % Final          lisinopril  (ZESTRIL ) 10 MG tablet      Sig: Take 1 tablet (10 mg total) by mouth 2 (two) times daily.     Cardiovascular:  ACE Inhibitors Passed - 11/25/2024 11:53 AM      Passed - Cr in normal range and within 180 days    Creat  Date Value Ref Range Status  10/21/2024 0.86 0.60 - 1.00 mg/dL Final   Creatinine, Urine  Date Value Ref Range Status  05/24/2024 25 20 - 275 mg/dL Final         Passed - K in normal range and within 180 days    Potassium  Date Value Ref Range Status  10/21/2024 4.7 3.5 - 5.3 mmol/L Final         Passed - Patient is not pregnant      Passed - Last BP in normal range    BP Readings from Last 1 Encounters:  10/21/24 118/80         Passed - Valid encounter  within last 6 months    Recent Outpatient Visits           1 month ago Type 2 diabetes mellitus with hyperglycemia, without long-term current use of insulin (HCC)   [facility] [facility] [facility] Aletha Bene, MD   6 months ago Type 2 diabetes mellitus with hyperglycemia, without long-term current use of insulin (HCC)   [place] [facility] Aletha Bene, MD   9 months ago Primary hypertension   [facility] Aletha Bene, MD               pantoprazole  (PROTONIX ) 20 MG tablet      Sig: Take 2 tablets (40 mg total) by mouth 2 (two) times daily.     Gastroenterology: Proton Pump Inhibitors Passed - 11/25/2024 11:53 AM      Passed - Valid encounter within last 12 months    Recent Outpatient Visits           1 month ago Type 2 diabetes mellitus with hyperglycemia, without long-term current use of insulin (HCC)   [facility] Aletha Bene, MD   6 months ago Type 2 diabetes mellitus with hyperglycemia, without long-term current use of insulin (HCC)   [facility] Aletha Bene, MD   9 months ago Primary hypertension    [facility] Aletha Bene, MD               ezetimibe  (ZETIA ) 10 MG tablet 90 tablet 0     Cardiovascular:  Antilipid - Sterol Transport Inhibitors Failed - 11/25/2024 11:53 AM      Failed - Lipid Panel in normal range within the last 12 months    Cholesterol  Date Value Ref Range Status  10/21/2024 173 <200 mg/dL Final   LDL Cholesterol (Calc)  Date Value Ref Range Status  10/21/2024 83 mg/dL (calc) Final    Comment:    Reference range: <100 . Desirable range <100 mg/dL for primary prevention;   <70 mg/dL for patients with CHD or diabetic patients  with > or = 2 CHD risk factors. SABRA  LDL-C is now calculated using the Martin-Hopkins  calculation, which is a validated novel method providing  better  accuracy than the Friedewald equation in the  estimation of LDL-C.  Gladis APPLETHWAITE et al. SANDREA. 7986;689(80): 2061-2068  (http://education.[employer].com/faq/FAQ164)    HDL  Date Value Ref Range Status  10/21/2024 74 > OR = 50 mg/dL Final   Triglycerides  Date Value Ref Range Status  10/21/2024 80 <150 mg/dL Final         Passed - AST in normal range and within 360 days    AST  Date Value Ref Range Status  10/21/2024 16 10 - 35 U/L Final         Passed - ALT in normal range and within 360 days    ALT  Date Value Ref Range Status  10/21/2024 8 6 - 29 U/L Final         Passed - Patient is not pregnant      Passed - Valid encounter within last 12 months    Recent Outpatient Visits           1 month ago Type 2 diabetes mellitus with hyperglycemia, without long-term current use of insulin (HCC)   [place] [facility] Aletha Bene, MD   6 months ago Type 2 diabetes mellitus with hyperglycemia, without long-term current use of insulin (HCC)   [place] [facility] Aletha Bene, MD   9 months ago Primary hypertension   [place] [facility] Aletha Bene, MD

## 2024-11-25 NOTE — Telephone Encounter (Signed)
 Requested medication (s) are due for refill today: yes  Requested medication (s) are on the active medication list: no  Last refill:  02/23/24 and 07/21/11  Future visit scheduled: yes  Notes to clinic:  Unable to refill per protocol, Rx expired.      Requested Prescriptions  Pending Prescriptions Disp Refills   lisinopril  (ZESTRIL ) 10 MG tablet      Sig: Take 1 tablet (10 mg total) by mouth 2 (two) times daily.     Cardiovascular:  ACE Inhibitors Passed - 11/25/2024 11:55 AM      Passed - Cr in normal range and within 180 days    Creat  Date Value Ref Range Status  10/21/2024 0.86 0.60 - 1.00 mg/dL Final   Creatinine, Urine  Date Value Ref Range Status  05/24/2024 25 20 - 275 mg/dL Final         Passed - K in normal range and within 180 days    Potassium  Date Value Ref Range Status  10/21/2024 4.7 3.5 - 5.3 mmol/L Final         Passed - Patient is not pregnant      Passed - Last BP in normal range    BP Readings from Last 1 Encounters:  10/21/24 118/80         Passed - Valid encounter within last 6 months    Recent Outpatient Visits           1 month ago Type 2 diabetes mellitus with hyperglycemia, without long-term current use of insulin (HCC)   [place] [facility] Aletha Bene, MD   6 months ago Type 2 diabetes mellitus with hyperglycemia, without long-term current use of insulin (HCC)   [place] [facility] Aletha Bene, MD   9 months ago Primary hypertension   [facility] Aletha Bene, MD               pantoprazole  (PROTONIX ) 20 MG tablet      Sig: Take 2 tablets (40 mg total) by mouth 2 (two) times daily.     Gastroenterology: Proton Pump Inhibitors Passed - 11/25/2024 11:55 AM      Passed - Valid encounter within last 12 months    Recent Outpatient Visits           1 month ago Type 2 diabetes mellitus with hyperglycemia, without long-term current use of insulin (HCC)    [facility] Aletha Bene, MD   6 months ago Type 2 diabetes mellitus with hyperglycemia, without long-term current use of insulin (HCC)   [place] [facility] Medicine Aletha Bene, MD   9 months ago Primary hypertension   [place] [facility] Aletha Bene, MD              Refused Prescriptions Disp Refills   metFORMIN  (GLUCOPHAGE -XR) 500 MG 24 hr tablet 90 tablet 1     Endocrinology:  Diabetes - Biguanides Failed - 11/25/2024 11:55 AM      Failed - B12 Level in normal range and within 720 days    No results found for: VITAMINB12       Passed - Cr in normal range and within 360 days    Creat  Date Value Ref Range Status  10/21/2024 0.86 0.60 - 1.00 mg/dL Final   Creatinine, Urine  Date Value Ref Range Status  05/24/2024 25 20 - 275 mg/dL Final  Passed - HBA1C is between 0 and 7.9 and within 180 days    Hgb A1c MFr Bld  Date Value Ref Range Status  10/21/2024 6.4 (H) <5.7 % Final    Comment:    For someone without known diabetes, a hemoglobin  A1c value between 5.7% and 6.4% is consistent with prediabetes and should be confirmed with a  follow-up test. . For someone with known diabetes, a value <7% indicates that their diabetes is well controlled. A1c targets should be individualized based on duration of diabetes, age, comorbid conditions, and other considerations. . This assay result is consistent with an increased risk of diabetes. . Currently, no consensus exists regarding use of hemoglobin A1c for diagnosis of diabetes for children. .          Passed - eGFR in normal range and within 360 days    GFR calc Af Amer  Date Value Ref Range Status  07/21/2011 >60 >60 mL/min Final    Comment:           The eGFR has been calculated using the MDRD equation. This calculation has not been validated in all clinical situations. eGFR's persistently <60 mL/min signify possible  Chronic Kidney Disease.   GFR calc non Af Amer  Date Value Ref Range Status  07/21/2011 >60 >60 mL/min Final   eGFR  Date Value Ref Range Status  10/21/2024 70 > OR = 60 mL/min/1.110m2 Final         Passed - Valid encounter within last 6 months    Recent Outpatient Visits           1 month ago Type 2 diabetes mellitus with hyperglycemia, without long-term current use of insulin (HCC)   [place] [facility] Aletha Bene, MD   6 months ago Type 2 diabetes mellitus with hyperglycemia, without long-term current use of insulin (HCC)   [facility] Aletha Bene, MD   9 months ago Primary hypertension   [facility] Aletha Bene, MD              Passed - CBC within normal limits and completed in the last 12 months    WBC  Date Value Ref Range Status  10/21/2024 7.2 3.8 - 10.8 Thousand/uL Final   RBC  Date Value Ref Range Status  10/21/2024 4.08 3.80 - 5.10 Million/uL Final   Hemoglobin  Date Value Ref Range Status  10/21/2024 11.6 (L) 11.7 - 15.5 g/dL Final   HCT  Date Value Ref Range Status  10/21/2024 36.4 35.9 - 46.0 % Final   MCHC  Date Value Ref Range Status  10/21/2024 31.9 31.6 - 35.4 g/dL Final   [facility]  Date Value Ref Range Status  10/21/2024 28.4 27.0 - 33.0 pg Final   MCV  Date Value Ref Range Status  10/21/2024 89.2 81.4 - 101.7 fL Final   No results found for: PLTCOUNTKUC, LABPLAT, POCPLA RDW  Date Value Ref Range Status  10/21/2024 13.6 11.0 - 15.0 % Final          ezetimibe  (ZETIA ) 10 MG tablet 90 tablet 0     Cardiovascular:  Antilipid - Sterol Transport Inhibitors Failed - 11/25/2024 11:55 AM      Failed - Lipid Panel in normal range within the last 12 months    Cholesterol  Date Value Ref Range Status  10/21/2024 173 <200 mg/dL Final   LDL Cholesterol (Calc)  Date Value Ref Range Status  10/21/2024 83 mg/dL (calc) Final    Comment:    Reference  range: <100 . Desirable range <100 mg/dL for primary prevention;   <70 mg/dL for patients with CHD or diabetic patients  with > or = 2 CHD risk factors. SABRA LDL-C is now calculated using the Martin-Hopkins  calculation, which is a validated novel method providing  better accuracy than the Friedewald equation in the  estimation of LDL-C.  Gladis APPLETHWAITE et al. SANDREA. 7986;689(80): 2061-2068  (http://education.[employer].com/faq/FAQ164)    HDL  Date Value Ref Range Status  10/21/2024 74 > OR = 50 mg/dL Final   Triglycerides  Date Value Ref Range Status  10/21/2024 80 <150 mg/dL Final         Passed - AST in normal range and within 360 days    AST  Date Value Ref Range Status  10/21/2024 16 10 - 35 U/L Final         Passed - ALT in normal range and within 360 days    ALT  Date Value Ref Range Status  10/21/2024 8 6 - 29 U/L Final         Passed - Patient is not pregnant      Passed - Valid encounter within last 12 months    Recent Outpatient Visits           1 month ago Type 2 diabetes mellitus with hyperglycemia, without long-term current use of insulin (HCC)   [facility] Aletha Bene, MD   6 months ago Type 2 diabetes mellitus with hyperglycemia, without long-term current use of insulin (HCC)   [place] [facility] Aletha Bene, MD   9 months ago Primary hypertension   [place] [facility] Aletha Bene, MD

## 2025-01-24 ENCOUNTER — Ambulatory Visit: Admitting: Family Medicine
